# Patient Record
Sex: Male | Born: 1970 | Race: White | Hispanic: No | Marital: Single | Smoking: Current every day smoker
Health system: Southern US, Community
[De-identification: ages and names within clinical notes are randomized; demographics above are authoritative.]

## PROBLEM LIST (undated history)

## (undated) DIAGNOSIS — R569 Unspecified convulsions: Secondary | ICD-10-CM

## (undated) HISTORY — PX: CRANIOTOMY: SHX93

---

## 2017-01-27 ENCOUNTER — Inpatient Hospital Stay (HOSPITAL_COMMUNITY)

## 2017-01-27 ENCOUNTER — Inpatient Hospital Stay

## 2017-01-27 ENCOUNTER — Emergency Department

## 2017-01-27 ENCOUNTER — Inpatient Hospital Stay
Admission: EM | Admit: 2017-01-27 | Discharge: 2017-01-29 | DRG: 100 | Attending: Internal Medicine | Admitting: Internal Medicine

## 2017-01-27 ENCOUNTER — Encounter: Payer: Self-pay | Admitting: Emergency Medicine

## 2017-01-27 DIAGNOSIS — J690 Pneumonitis due to inhalation of food and vomit: Secondary | ICD-10-CM | POA: Diagnosis present

## 2017-01-27 DIAGNOSIS — G40411 Other generalized epilepsy and epileptic syndromes, intractable, with status epilepticus: Secondary | ICD-10-CM | POA: Diagnosis present

## 2017-01-27 DIAGNOSIS — Z4659 Encounter for fitting and adjustment of other gastrointestinal appliance and device: Secondary | ICD-10-CM

## 2017-01-27 DIAGNOSIS — R401 Stupor: Secondary | ICD-10-CM | POA: Diagnosis not present

## 2017-01-27 DIAGNOSIS — R569 Unspecified convulsions: Secondary | ICD-10-CM | POA: Diagnosis present

## 2017-01-27 DIAGNOSIS — N179 Acute kidney failure, unspecified: Secondary | ICD-10-CM | POA: Diagnosis present

## 2017-01-27 DIAGNOSIS — Z9911 Dependence on respirator [ventilator] status: Secondary | ICD-10-CM | POA: Diagnosis not present

## 2017-01-27 DIAGNOSIS — J9811 Atelectasis: Secondary | ICD-10-CM | POA: Diagnosis present

## 2017-01-27 DIAGNOSIS — J9690 Respiratory failure, unspecified, unspecified whether with hypoxia or hypercapnia: Secondary | ICD-10-CM

## 2017-01-27 DIAGNOSIS — F17210 Nicotine dependence, cigarettes, uncomplicated: Secondary | ICD-10-CM | POA: Diagnosis present

## 2017-01-27 DIAGNOSIS — Z87820 Personal history of traumatic brain injury: Secondary | ICD-10-CM

## 2017-01-27 DIAGNOSIS — Z21 Asymptomatic human immunodeficiency virus [HIV] infection status: Secondary | ICD-10-CM | POA: Diagnosis present

## 2017-01-27 DIAGNOSIS — J9601 Acute respiratory failure with hypoxia: Secondary | ICD-10-CM | POA: Diagnosis present

## 2017-01-27 HISTORY — DX: Unspecified convulsions: R56.9

## 2017-01-27 LAB — CBC
HCT: 44.4 % (ref 40.0–52.0)
Hemoglobin: 15.2 g/dL (ref 13.0–18.0)
MCH: 32.1 pg (ref 26.0–34.0)
MCHC: 34.2 g/dL (ref 32.0–36.0)
MCV: 93.8 fL (ref 80.0–100.0)
PLATELETS: 165 10*3/uL (ref 150–440)
RBC: 4.73 MIL/uL (ref 4.40–5.90)
RDW: 12.8 % (ref 11.5–14.5)
WBC: 10.7 10*3/uL — AB (ref 3.8–10.6)

## 2017-01-27 LAB — URINE DRUG SCREEN, QUALITATIVE (ARMC ONLY)
AMPHETAMINES, UR SCREEN: POSITIVE — AB
Amphetamines, Ur Screen: POSITIVE — AB
BARBITURATES, UR SCREEN: NOT DETECTED
BENZODIAZEPINE, UR SCRN: NOT DETECTED
Barbiturates, Ur Screen: NOT DETECTED
Benzodiazepine, Ur Scrn: NOT DETECTED
COCAINE METABOLITE, UR ~~LOC~~: NOT DETECTED
Cannabinoid 50 Ng, Ur ~~LOC~~: NOT DETECTED
Cannabinoid 50 Ng, Ur ~~LOC~~: NOT DETECTED
Cocaine Metabolite,Ur ~~LOC~~: NOT DETECTED
MDMA (ECSTASY) UR SCREEN: NOT DETECTED
MDMA (Ecstasy)Ur Screen: NOT DETECTED
METHADONE SCREEN, URINE: NOT DETECTED
Methadone Scn, Ur: NOT DETECTED
OPIATE, UR SCREEN: NOT DETECTED
Opiate, Ur Screen: NOT DETECTED
PHENCYCLIDINE (PCP) UR S: NOT DETECTED
PHENCYCLIDINE (PCP) UR S: NOT DETECTED
TRICYCLIC, UR SCREEN: NOT DETECTED
Tricyclic, Ur Screen: NOT DETECTED

## 2017-01-27 LAB — URINALYSIS, COMPLETE (UACMP) WITH MICROSCOPIC
BACTERIA UA: NONE SEEN
BILIRUBIN URINE: NEGATIVE
Bilirubin Urine: NEGATIVE
GLUCOSE, UA: NEGATIVE mg/dL
Glucose, UA: NEGATIVE mg/dL
KETONES UR: NEGATIVE mg/dL
Ketones, ur: NEGATIVE mg/dL
LEUKOCYTES UA: NEGATIVE
Leukocytes, UA: NEGATIVE
NITRITE: NEGATIVE
Nitrite: NEGATIVE
PH: 5 (ref 5.0–8.0)
PROTEIN: 100 mg/dL — AB
Protein, ur: 100 mg/dL — AB
SPECIFIC GRAVITY, URINE: 1.018 (ref 1.005–1.030)
SQUAMOUS EPITHELIAL / LPF: NONE SEEN
Specific Gravity, Urine: 1.018 (ref 1.005–1.030)
pH: 5 (ref 5.0–8.0)

## 2017-01-27 LAB — BASIC METABOLIC PANEL
ANION GAP: 13 (ref 5–15)
BUN: 11 mg/dL (ref 6–20)
CHLORIDE: 103 mmol/L (ref 101–111)
CO2: 19 mmol/L — ABNORMAL LOW (ref 22–32)
Calcium: 8.8 mg/dL — ABNORMAL LOW (ref 8.9–10.3)
Creatinine, Ser: 1.3 mg/dL — ABNORMAL HIGH (ref 0.61–1.24)
Glucose, Bld: 102 mg/dL — ABNORMAL HIGH (ref 65–99)
POTASSIUM: 3.6 mmol/L (ref 3.5–5.1)
SODIUM: 135 mmol/L (ref 135–145)

## 2017-01-27 LAB — BLOOD GAS, ARTERIAL
ALLENS TEST (PASS/FAIL): POSITIVE — AB
Acid-base deficit: 3.5 mmol/L — ABNORMAL HIGH (ref 0.0–2.0)
Bicarbonate: 20.4 mmol/L (ref 20.0–28.0)
FIO2: 0.6
LHR: 20 {breaths}/min
O2 SAT: 94 %
PATIENT TEMPERATURE: 37
PCO2 ART: 33 mmHg (ref 32.0–48.0)
PEEP/CPAP: 5 cmH2O
VT: 500 mL
pH, Arterial: 7.4 (ref 7.350–7.450)
pO2, Arterial: 71 mmHg — ABNORMAL LOW (ref 83.0–108.0)

## 2017-01-27 LAB — PHOSPHORUS: Phosphorus: 2.2 mg/dL — ABNORMAL LOW (ref 2.5–4.6)

## 2017-01-27 LAB — MRSA PCR SCREENING: MRSA by PCR: NEGATIVE

## 2017-01-27 LAB — GLUCOSE, CAPILLARY: Glucose-Capillary: 72 mg/dL (ref 65–99)

## 2017-01-27 LAB — ETHANOL

## 2017-01-27 LAB — MAGNESIUM: Magnesium: 1.8 mg/dL (ref 1.7–2.4)

## 2017-01-27 MED ORDER — ACETAMINOPHEN 650 MG RE SUPP: 650.0000 mg | Freq: Four times a day (QID) | RECTAL | Status: DC | PRN

## 2017-01-27 MED ORDER — ENOXAPARIN SODIUM 40 MG/0.4ML ~~LOC~~ SOLN
40.0000 mg | SUBCUTANEOUS | Status: DC
  Administered 2017-01-27: 40 mg via SUBCUTANEOUS
  Filled 2017-01-27: qty 0.4

## 2017-01-27 MED ORDER — PIPERACILLIN-TAZOBACTAM 3.375 G IVPB
3.3750 g | Freq: Three times a day (TID) | INTRAVENOUS | Status: DC
  Administered 2017-01-27 – 2017-01-28 (×3): 3.375 g via INTRAVENOUS
  Filled 2017-01-27 (×5): qty 50

## 2017-01-27 MED ORDER — SODIUM CHLORIDE 0.9 % IV SOLN
1000.0000 mg | Freq: Once | INTRAVENOUS | Status: AC
  Administered 2017-01-27: 1000 mg via INTRAVENOUS
  Filled 2017-01-27: qty 10

## 2017-01-27 MED ORDER — SODIUM CHLORIDE 0.9 % IV BOLUS (SEPSIS)
1000.0000 mL | Freq: Once | INTRAVENOUS | Status: AC
  Administered 2017-01-27: 1000 mL via INTRAVENOUS

## 2017-01-27 MED ORDER — ONDANSETRON HCL 4 MG PO TABS: 4.0000 mg | ORAL_TABLET | Freq: Four times a day (QID) | ORAL | Status: DC | PRN

## 2017-01-27 MED ORDER — MIDAZOLAM HCL 2 MG/2ML IJ SOLN
INTRAMUSCULAR | Status: AC
  Administered 2017-01-27: 2 mg via INTRAVENOUS
  Filled 2017-01-27: qty 2

## 2017-01-27 MED ORDER — ONDANSETRON HCL 4 MG/2ML IJ SOLN: 4.0000 mg | Freq: Four times a day (QID) | INTRAMUSCULAR | Status: DC | PRN

## 2017-01-27 MED ORDER — KCL-LACTATED RINGERS-D5W 20 MEQ/L IV SOLN
INTRAVENOUS | Status: DC
  Administered 2017-01-27 – 2017-01-28 (×2): via INTRAVENOUS
  Filled 2017-01-27 (×3): qty 1000

## 2017-01-27 MED ORDER — ACETAMINOPHEN 325 MG PO TABS: 650.0000 mg | ORAL_TABLET | Freq: Four times a day (QID) | ORAL | Status: DC | PRN

## 2017-01-27 MED ORDER — SODIUM CHLORIDE 0.9 % IV SOLN
500.0000 mg | Freq: Two times a day (BID) | INTRAVENOUS | Status: DC
  Filled 2017-01-27 (×2): qty 5

## 2017-01-27 MED ORDER — SODIUM CHLORIDE 0.9 % IV SOLN
500.0000 mg | Freq: Two times a day (BID) | INTRAVENOUS | Status: DC
  Administered 2017-01-27 – 2017-01-28 (×2): 500 mg via INTRAVENOUS
  Filled 2017-01-27 (×3): qty 5

## 2017-01-27 MED ORDER — FENTANYL CITRATE (PF) 100 MCG/2ML IJ SOLN
50.0000 ug | Freq: Once | INTRAMUSCULAR | Status: AC
  Administered 2017-01-27: 50 ug via INTRAVENOUS

## 2017-01-27 MED ORDER — CHLORHEXIDINE GLUCONATE 0.12% ORAL RINSE (MEDLINE KIT)
15.0000 mL | Freq: Two times a day (BID) | OROMUCOSAL | Status: DC
  Administered 2017-01-27 – 2017-01-28 (×2): 15 mL via OROMUCOSAL

## 2017-01-27 MED ORDER — MIDAZOLAM HCL 2 MG/2ML IJ SOLN
2.0000 mg | Freq: Once | INTRAMUSCULAR | Status: AC
  Administered 2017-01-27: 2 mg via INTRAVENOUS

## 2017-01-27 MED ORDER — SODIUM CHLORIDE 0.9 % IV SOLN
0.0000 ug/h | INTRAVENOUS | Status: DC
  Administered 2017-01-27: 50 ug/h via INTRAVENOUS
  Filled 2017-01-27: qty 50

## 2017-01-27 MED ORDER — VANCOMYCIN HCL IN DEXTROSE 1-5 GM/200ML-% IV SOLN
1000.0000 mg | Freq: Once | INTRAVENOUS | Status: AC
  Administered 2017-01-27: 1000 mg via INTRAVENOUS
  Filled 2017-01-27: qty 200

## 2017-01-27 MED ORDER — LORAZEPAM 2 MG/ML IJ SOLN
INTRAMUSCULAR | Status: AC
  Administered 2017-01-27: 2 mg via INTRAVENOUS
  Filled 2017-01-27: qty 1

## 2017-01-27 MED ORDER — SODIUM CHLORIDE 0.9 % IV SOLN: INTRAVENOUS | Status: DC

## 2017-01-27 MED ORDER — ALBUTEROL SULFATE (2.5 MG/3ML) 0.083% IN NEBU: 2.5000 mg | INHALATION_SOLUTION | RESPIRATORY_TRACT | Status: DC | PRN

## 2017-01-27 MED ORDER — MIDAZOLAM HCL 5 MG/ML IJ SOLN
0.0000 mg/h | INTRAMUSCULAR | Status: DC
  Administered 2017-01-27: 1 mg/h via INTRAVENOUS
  Filled 2017-01-27 (×2): qty 10

## 2017-01-27 MED ORDER — BLISTEX MEDICATED EX OINT
TOPICAL_OINTMENT | CUTANEOUS | Status: DC | PRN
  Filled 2017-01-27: qty 6.3

## 2017-01-27 MED ORDER — LORAZEPAM 2 MG/ML IJ SOLN
2.0000 mg | Freq: Once | INTRAMUSCULAR | Status: AC
  Administered 2017-01-27: 2 mg via INTRAVENOUS

## 2017-01-27 MED ORDER — FAMOTIDINE IN NACL 20-0.9 MG/50ML-% IV SOLN
20.0000 mg | Freq: Two times a day (BID) | INTRAVENOUS | Status: DC
  Administered 2017-01-27: 20 mg via INTRAVENOUS
  Filled 2017-01-27: qty 50

## 2017-01-27 MED ORDER — PIPERACILLIN-TAZOBACTAM 3.375 G IVPB 30 MIN
3.3750 g | Freq: Once | INTRAVENOUS | Status: AC
  Administered 2017-01-27: 3.375 g via INTRAVENOUS
  Filled 2017-01-27: qty 50

## 2017-01-27 MED ORDER — ORAL CARE MOUTH RINSE
15.0000 mL | Freq: Four times a day (QID) | OROMUCOSAL | Status: DC
  Administered 2017-01-27: 15 mL via OROMUCOSAL

## 2017-01-27 MED ORDER — SUCCINYLCHOLINE CHLORIDE 20 MG/ML IJ SOLN
100.0000 mg | Freq: Once | INTRAMUSCULAR | Status: AC
  Administered 2017-01-27: 100 mg via INTRAVENOUS

## 2017-01-27 MED ORDER — ETOMIDATE 2 MG/ML IV SOLN
20.0000 mg | Freq: Once | INTRAVENOUS | Status: AC
  Administered 2017-01-27: 20 mg via INTRAVENOUS

## 2017-01-27 NOTE — Progress Notes (Signed)
Pharmacy Antibiotic Note  Lenoria ChimeChristopher Steffensmeier is a 46 y.o. male admitted on 01/27/2017 with seizures/Aspiration PNA?  Pharmacy has been consulted for Zosyn dosing.  Plan: Zosyn 3.375g IV q8h (4 hour infusion).    Height: 5\' 9"  (175.3 cm) Weight: 150 lb (68 kg) IBW/kg (Calculated) : 70.7  Temp (24hrs), Avg:98.2 F (36.8 C), Min:98.2 F (36.8 C), Max:98.2 F (36.8 C)   Recent Labs Lab 01/27/17 0501  WBC 10.7*  CREATININE 1.30*    Estimated Creatinine Clearance: 69.7 mL/min (A) (by C-G formula based on SCr of 1.3 mg/dL (H)).    No Known Allergies  Antimicrobials this admission: Zosyn 6/18 >>    Vanc x 1 6/18      Dose adjustments this admission:    Microbiology results: 6/19 BCx: pending   UCx:     Sputum:      MRSA PCR:   Chest x-ray shows bibasilar opacity possible aspiration   Thank you for allowing pharmacy to be a part of this patient's care.  Ellsie Violette A 01/27/2017 10:52 AM

## 2017-01-27 NOTE — Progress Notes (Signed)
   01/27/17 1204  Vitals  Temp 99.2 F (37.3 C)  Temp Source Oral  BP 112/71  MAP (mmHg) 84  Pulse Rate 87  ECG Heart Rate 86  Resp 20  Oxygen Therapy  SpO2 99 %  O2 Device Ventilator  FiO2 (%) 60 %  Pulse Oximetry Type Continuous  End Tidal CO2 (EtCO2) 25  Glasgow Coma Scale  Eye Opening 1  Modified Verbal Response (INTUBATED) 1  Best Motor Response 4  Glasgow Coma Scale Score 6  Height and Weight  Height 5\' 6"  (1.676 m)  Weight 69.6 kg (153 lb 7 oz)  Type of Scale Used Bed  Type of Weight Actual  Drug Calculation Weight 69.6 kg (153 lb 7 oz)  BSA (Calculated - sq m) 1.8 sq meters  BMI (Calculated) 24.8  Weight in (lb) to have BMI = 25 154.6

## 2017-01-27 NOTE — Plan of Care (Addendum)
Pt's Mother called, she stated [facility] stating that the patient was in the hospital. She wanted to relay the medications that he is receiving in the community    Topiramate 200 mg PO bid Lacosamide 100 mg PO BID Keppra 15 mg PO BID Zoloft 150 PO QD  She stated that he is on a HIV medication and that it was recently changed, she didn't know the name but [facility] and [facility] at the [facility] on [address] in [place]. They would probably know.  Pt's mother is Benedetto CoonsWanda Fauth [phone]   [facility]. She understands that she cannot visit at this time  Would appreciate pharmacy input for med reconcilliation. Thanks

## 2017-01-27 NOTE — H&P (Signed)
[facility] [place] at [facility]   PATIENT NAME: Jeffery Holmes    MR#:  [id number]  DATE OF BIRTH:  03/26/1971  DATE OF ADMISSION:  01/27/2017  PRIMARY CARE PHYSICIAN: Patient, No Pcp Per   REQUESTING/REFERRING PHYSICIAN: Dr. Zenda Alpers  CHIEF COMPLAINT:  Intractable seizures and hypoxia  HISTORY OF PRESENT ILLNESS:  Jeffery Holmes  is a 46 y.o. male with a known history ofKnown seizures suspected due to traumatic brain injury with history of craniotomy in the past for unknown reason comes to the emergency room from [facility] after having intractable seizures first few times. Patient was in the jail since Monday which is 3 days ago and has not been getting his and has seizure medicine due to reason not known. He was brought to the emergency room where seizures generalized tonic-clonic were witnessed by ED M.D. Patient started becoming hypoxic and postictal was unable to protect his airways and got intubated and on the ventilator his sats dropped down in the 80s. Chest x-ray shows bibasilar opacity possible aspiration with significant amount of sputum in the ET tube. He received IV vancomycin and Zosyn. He received Ativan and Keppra. Patient is being admitted for further evaluation management. No family in the ER.   PAST MEDICAL HISTORY:   Past Medical History:  Diagnosis Date  . Seizures (HCC)     PAST SURGICAL HISTOIRY:   Past Surgical History:  Procedure Laterality Date  . CRANIOTOMY      SOCIAL HISTORY:   Social History  Substance Use Topics  . Smoking status: Current Every Day Smoker  . Smokeless tobacco: Former Neurosurgeon  . Alcohol use No    FAMILY HISTORY:  History reviewed. No pertinent family history. Unable to obtain since patient is intubated  DRUG ALLERGIES:  No Known Allergies  REVIEW OF SYSTEMS:  Review of Systems  Unable to perform ROS: Intubated     MEDICATIONS AT HOME:   Prior to Admission medications   Not on  File      VITAL SIGNS:  Blood pressure 117/79, pulse 83, temperature 98.2 F (36.8 C), temperature source Oral, resp. rate (!) 22, height 5\' 9"  (1.753 m), weight 68 kg (150 lb), SpO2 97 %.  PHYSICAL EXAMINATION:  GENERAL:  47 y.o.-year-old patient lying in the bed with no acute distress. Critically ill intubated on the ventilator  EYES: Pupils equal, round, reactive to light and accommodation. No scleral icterus. Extraocular muscles intact.  HEENT: Head atraumatic, normocephalic. Oropharynx and nasopharynx clear.  NECK:  Supple, no jugular venous distention. No thyroid enlargement, no tenderness.  LUNGS: Normal breath sounds bilaterally, no wheezing, rales,rhonchi or crepitation. No use of accessory muscles of respiration.  CARDIOVASCULAR: S1, S2 normal. No murmurs, rubs, or gallops.  ABDOMEN: Soft, nontender, nondistended. Bowel sounds present. No organomegaly or mass.  EXTREMITIES: No pedal edema, cyanosis, or clubbing.  NEUROLOGIC:Intubated Psychiatry C: The patient isintubated and on the ventilator SKIN: No obvious rash, lesion, or ulcer.   LABORATORY PANEL:   CBC  Recent Labs Lab 01/27/17 0501  WBC 10.7*  HGB 15.2  HCT 44.4  PLT 165   ------------------------------------------------------------------------------------------------------------------  Chemistries   Recent Labs Lab 01/27/17 0501  NA 135  K 3.6  CL 103  CO2 19*  GLUCOSE 102*  BUN 11  CREATININE 1.30*  CALCIUM 8.8*   ------------------------------------------------------------------------------------------------------------------  Cardiac Enzymes No results for input(s): TROPONINI in the last 168 hours. ------------------------------------------------------------------------------------------------------------------  RADIOLOGY:  Ct Head Wo Contrast  Result Date: 01/27/2017 CLINICAL DATA:  Seizure in jail, striking head. EXAM: CT HEAD WITHOUT CONTRAST TECHNIQUE: Contiguous axial images were  obtained from the base of the skull through the vertex without intravenous contrast. COMPARISON:  None. FINDINGS: Brain: Large area of right frontal encephalomalacia with associated ex vacuo dilatation of the right lateral ventricle. No acute hemorrhage, mass effect or midline shift. No subdural or extra-axial fluid collection. Basilar cisterns are patent. Vascular: No hyperdense vessel or unexpected calcification. Skull: Right temporofrontal craniotomy.  No acute fracture. Sinuses/Orbits: Mucosal thickening involving the right maxillary sinus, sphenoid sinus and ethmoid air cells. Mastoid air cells are clear. Visualized orbits are unremarkable. Other: None. IMPRESSION: 1. No evidence of acute intracranial abnormality. 2. Right frontal craniotomy with encephalomalacia of the right frontal lobe. Ex vacuo dilatation of the right lateral ventricle. 3. Paranasal sinus inflammation, involving right maxillary sinus, sphenoid sinuses and scattered ethmoid air cells. Electronically Signed   By: Rubye Oaks M.D.   On: 01/27/2017 05:32   Dg Chest Portable 1 View  Result Date: 01/27/2017 CLINICAL DATA:  Initial evaluation for endotracheal tube placement. EXAM: PORTABLE CHEST 1 VIEW COMPARISON:  None available. FINDINGS: Endotracheal tube in place with tip positioned 3.1 cm above the carina. Heart size within normal limits. Mediastinal silhouette normal. Lungs hypoinflated. Bibasilar opacities favored to reflect atelectasis, although superimposed infiltrate could be considered in the correct clinical setting. No pulmonary edema or pleural effusion. No pneumothorax. No acute osseus abnormality. IMPRESSION: 1. Tip of the endotracheal tube 3.1 cm above the carina. 2. Shallow lung inflation with patchy bibasilar opacities, left greater than right. Atelectasis is favored, although infiltrates could be considered in the correct clinical setting. Electronically Signed   By: Rise Mu M.D.   On: 01/27/2017 06:40     EKG:  Normal sinus rhythm  IMPRESSION AND PLAN:   Jeffery Holmes  is a 46 y.o. male with a known history ofKnown seizures suspected due to traumatic brain injury with history of craniotomy in the past for unknown reason comes to the emergency room from [facility] after having intractable seizures first few times. Patient was in the jail since Monday which is 3 days ago and has not been getting his and has seizure medicine due to reason not known. He was brought to the emergency room where seizures generalized tonic-clonic were witnessed by ED M.D.   1. Generalized tonic-clonic seizures with known history of chronic seizures suspected due to patient not taking his medication since his being in the jail for last 3 days -Admit to ICU -IV Keppra 500 twice a day -Neurology consultation -CT head shows chronic changes from craniotomy in the past -Patient has history of TBI (reason not known) -Continue fentanyl and Versed for sedation.  2. Acute hypoxic respiratory failure in the setting of tonic-clonic seizures and bilateral opacities on chest x-ray with possibility of aspiration pneumonia -IV Zosyn for now. Pharmacy consultation -Sputum culture -Follow blood culture  3. History of substance abuse -Urine drug screen  4. Acute renal failure appears prerenal azotemia -continue IV fluids  5. DVT prophylaxis subcutaneous Lovenox  Patient is critically ill. ICU attending is aware of patient's admission No family present   All the records are reviewed and case discussed with ED provider. Management plans discussed with the patient, family and they are in agreement.  CODE STATUS:Full  TOTALCRITICAL  TIME TAKING CARE OF THIS PATIENT: 50  minutes.    Annalysia Willenbring M.D on 01/27/2017 at 7:37 AM  Between 7am to 6pm - Pager - [phone]  After  6pm go to [email or web address] - password [email or web address] [facility]  [facility]  Office  [phone]  CC: Primary care physician; Patient, No Pcp  Per

## 2017-01-27 NOTE — Progress Notes (Signed)
Pharmacy Antibiotic Note  Jeffery Holmes is a 46 y.o. male admitted on 01/27/2017 with seizures/Aspiration PNA?  Pharmacy has been consulted for Zosyn dosing.  Plan: Continue Zosyn 3.375g IV q8h (4 hour infusion).    Height: 5\' 6"  (167.6 cm) Weight: 153 lb 7 oz (69.6 kg) IBW/kg (Calculated) : 63.8  Temp (24hrs), Avg:98.7 F (37.1 C), Min:98.2 F (36.8 C), Max:99.2 F (37.3 C)   Recent Labs Lab 01/27/17 0501  WBC 10.7*  CREATININE 1.30*    Estimated Creatinine Clearance: 65.4 mL/min (A) (by C-G formula based on SCr of 1.3 mg/dL (H)).    No Known Allergies  Antimicrobials this admission: Zosyn 6/18 >>    Vanc x 1 6/18      Dose adjustments this admission:    Microbiology results: 6/19 BCx: sent UCx: sent Sputum:   sent  MRSA PCR:  negative Chest x-ray shows bibasilar opacity possible aspiration   Thank you for allowing pharmacy to be a part of this patient's care.  Valentina GuChristy, Braileigh Landenberger D 01/27/2017 3:46 PM

## 2017-01-27 NOTE — ED Notes (Signed)
Pt unable to keep O2 above 90% on non re breather and nasal trumpet. MD made aware.

## 2017-01-27 NOTE — ED Notes (Signed)
Forensic restraints taken off per [facility] officer.

## 2017-01-27 NOTE — Progress Notes (Signed)
   01/27/17 1700  Vitals  Temp 99.9 F (37.7 C)  Temp Source Oral  BP 111/74  MAP (mmHg) 86  Pulse Rate 95  ECG Heart Rate 95  Resp 17  Oxygen Therapy  SpO2 97 %  O2 Device Ventilator  FiO2 (%) 50 %  Pulse Oximetry Type Continuous  End Tidal CO2 (EtCO2) 31

## 2017-01-27 NOTE — Consult Note (Signed)
Reason for Consult:Status Epilepticus Referring Physician: Allena Katz  CC: Status Epilepticus  HPI: Jeffery Holmes is an 46 y.o. male who is unable to provide any history due to intubation and sedation and no family available.  All history obtained from the chart. Patient came to the hospital yesterday with 3 seizures. According to EMS the patient had 3 seizures since about 1:30 AM. The patient came from jail. He did not receive any medications while there. According to the patient he does have a history of seizures but the facility could not verify the patient's medications and he remained 3 days without seizure medications. According to EMS the patient bumped all over the cell and has some bruises to his head and his shoulder. Patient had another seizure in the ED and was intubated.   The patient denied drinking. 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   Past Medical History:  Diagnosis Date  . Seizures (HCC)     Past Surgical History:  Procedure Laterality Date  . CRANIOTOMY      Family history: Unable to obtain due to intubation and sedation  Social History:  reports that he has been smoking.  He has quit using smokeless tobacco. He reports that he uses drugs, including Methamphetamines. He reports that he does not drink alcohol.  No Known Allergies  Medications:  I have reviewed the patient's current medications. Prior to Admission:  (Not in a hospital admission) Scheduled: . enoxaparin (LOVENOX) injection  40 mg Subcutaneous Q24H    ROS: Unable to obtain due to intubation and sedation  Physical Examination: Blood pressure 120/82, pulse 83, temperature 98.2 F (36.8 C), temperature source Oral, resp. rate (!) 27, height 5\' 9"  (1.753 m), weight 68 kg (150 lb), SpO2 98 %.  HEENT-  Normocephalic, no lesions, without obvious abnormality.  Normal external eye and conjunctiva.  Normal TM's bilaterally.  Normal auditory canals and external ears. Normal external nose, mucus  membranes and septum.  Normal pharynx. Cardiovascular- S1, S2 normal, pulses palpable throughout   Lungs- chest clear, no wheezing, rales, normal symmetric air entry Abdomen- soft, non-tender; bowel sounds normal; no masses,  no organomegaly Extremities- no edema Lymph-no adenopathy palpable Musculoskeletal-no joint tenderness, deformity or swelling Skin-warm and dry, no hyperpigmentation, vitiligo, or suspicious lesions  Neurological Examination   Mental Status: Patient does not respond to verbal stimuli.  Does not respond to deep sternal rub.  Does not follow commands.  No verbalizations noted.  Cranial Nerves: II: patient does not respond confrontation bilaterally, pupils right 2 mm, left 2 mm,and sluggishly bilaterally III,IV,VI: doll's response absent bilaterally.  V,VII: corneal reflex reduced bilaterally  VIII: patient does not respond to verbal stimuli IX,X: gag reflex reduced, XI: trapezius strength unable to test bilaterally XII: tongue strength unable to test Motor: Extremities flaccid throughout.  No spontaneous movement noted.  No purposeful movements noted. Sensory: Does not respond to noxious stimuli in any extremity. Deep Tendon Reflexes:  2+ throughout. Plantars: downgoing bilaterally Cerebellar: Unable to perform   Laboratory Studies:   Basic Metabolic Panel:  Recent Labs Lab 01/27/17 0501  NA 135  K 3.6  CL 103  CO2 19*  GLUCOSE 102*  BUN 11  CREATININE 1.30*  CALCIUM 8.8*    Liver Function Tests: No results for input(s): AST, ALT, ALKPHOS, BILITOT, PROT, ALBUMIN in the last 168 hours. No results for input(s): LIPASE, AMYLASE in the last 168 hours. No results for input(s): AMMONIA in the last 168 hours.  CBC:  Recent Labs Lab  01/27/17 0501  WBC 10.7*  HGB 15.2  HCT 44.4  MCV 93.8  PLT 165    Cardiac Enzymes: No results for input(s): CKTOTAL, CKMB, CKMBINDEX, TROPONINI in the last 168 hours.  BNP: Invalid input(s):  POCBNP  CBG: No results for input(s): GLUCAP in the last 168 hours.  Microbiology: No results found for this or any previous visit.  Coagulation Studies: No results for input(s): LABPROT, INR in the last 72 hours.  Urinalysis:  Recent Labs Lab 01/27/17 0733  COLORURINE YELLOW*  LABSPEC 1.018  PHURINE 5.0  GLUCOSEU NEGATIVE  HGBUR MODERATE*  BILIRUBINUR NEGATIVE  KETONESUR NEGATIVE  PROTEINUR 100*  NITRITE NEGATIVE  LEUKOCYTESUR NEGATIVE    Lipid Panel:  No results found for: CHOL, TRIG, HDL, CHOLHDL, VLDL, LDLCALC  HgbA1C: No results found for: HGBA1C  Urine Drug Screen:     Component Value Date/Time   LABOPIA NONE DETECTED 01/27/2017 0638   COCAINSCRNUR NONE DETECTED 01/27/2017 0638   LABBENZ NONE DETECTED 01/27/2017 0638   AMPHETMU POSITIVE (A) 01/27/2017 0638   THCU NONE DETECTED 01/27/2017 0638   LABBARB NONE DETECTED 01/27/2017 0638    Alcohol Level:  Recent Labs Lab 01/27/17 0501  ETH <5    Imaging: Ct Head Wo Contrast  Result Date: 01/27/2017 CLINICAL DATA:  Seizure in jail, striking head. EXAM: CT HEAD WITHOUT CONTRAST TECHNIQUE: Contiguous axial images were obtained from the base of the skull through the vertex without intravenous contrast. COMPARISON:  None. FINDINGS: Brain: Large area of right frontal encephalomalacia with associated ex vacuo dilatation of the right lateral ventricle. No acute hemorrhage, mass effect or midline shift. No subdural or extra-axial fluid collection. Basilar cisterns are patent. Vascular: No hyperdense vessel or unexpected calcification. Skull: Right temporofrontal craniotomy.  No acute fracture. Sinuses/Orbits: Mucosal thickening involving the right maxillary sinus, sphenoid sinus and ethmoid air cells. Mastoid air cells are clear. Visualized orbits are unremarkable. Other: None. IMPRESSION: 1. No evidence of acute intracranial abnormality. 2. Right frontal craniotomy with encephalomalacia of the right frontal lobe. Ex vacuo  dilatation of the right lateral ventricle. 3. Paranasal sinus inflammation, involving right maxillary sinus, sphenoid sinuses and scattered ethmoid air cells. Electronically Signed   By: Rubye OaksMelanie  Ehinger M.D.   On: 01/27/2017 05:32   Dg Chest Portable 1 View  Result Date: 01/27/2017 CLINICAL DATA:  Initial evaluation for endotracheal tube placement. EXAM: PORTABLE CHEST 1 VIEW COMPARISON:  None available. FINDINGS: Endotracheal tube in place with tip positioned 3.1 cm above the carina. Heart size within normal limits. Mediastinal silhouette normal. Lungs hypoinflated. Bibasilar opacities favored to reflect atelectasis, although superimposed infiltrate could be considered in the correct clinical setting. No pulmonary edema or pleural effusion. No pneumothorax. No acute osseus abnormality. IMPRESSION: 1. Tip of the endotracheal tube 3.1 cm above the carina. 2. Shallow lung inflation with patchy bibasilar opacities, left greater than right. Atelectasis is favored, although infiltrates could be considered in the correct clinical setting. Electronically Signed   By: Rise MuBenjamin  McClintock M.D.   On: 01/27/2017 06:40     Assessment/Plan: 46 year old male with a history of seizures on unknown anticonvulsant therapy now without medications for three days.  Patient presents with status epilepticus.  Now intubated and sedated with no clinical evidence of seizure activity noted.  Head CT reviewed and shows evidence of craniotomy but no acute changes.  Patient loaded with Keppra.     Recommendations: 1.  EEG stat.  Tech aware. 2.  Continue maintenance Keppra 500mg  IV q12 hours with  initiation of home meds once these are determined. 3.  Seizure precautions   Thana Farr, MD Neurology [phone] 01/27/2017, 11:28 AM

## 2017-01-27 NOTE — ED Notes (Signed)
Pt stops talking to this RN, pt starring into space, RN to hold pt while this RN to pyxis for medication. Pt rolled to side, airway maintained with 7.620mm nasal trumpet and non re breather. MD at bedside.

## 2017-01-27 NOTE — ED Notes (Addendum)
Pt resting in bed, sheriff dept guard at bedside, vitals stable, will continue to monitor, tolerating ET tube

## 2017-01-27 NOTE — Consult Note (Signed)
PULMONARY / CRITICAL CARE MEDICINE   Name: Jeffery Holmes MRN: [id number] DOB: 1970-11-21    ADMISSION DATE:  01/27/2017  PT PROFILE: 65 M with seizure disorder who was in police custody and without access to his anticonvulsants. He suffered grand mal seizure. Brought to emergency department where he required intubation for airway protection  HISTORY OF PRESENT ILLNESS:   Level V caveat  PAST MEDICAL HISTORY :  He  has a past medical history of Seizures (HCC).  PAST SURGICAL HISTORY: He  has a past surgical history that includes Craniotomy.  No Known Allergies  No current facility-administered medications on file prior to encounter.    No current outpatient prescriptions on file prior to encounter.    FAMILY HISTORY:  His has no family status information on file.    SOCIAL HISTORY: He  reports that he has been smoking.  He has quit using smokeless tobacco. He reports that he uses drugs, including Methamphetamines. He reports that he does not drink alcohol.  REVIEW OF SYSTEMS:   Level V caveat  SUBJECTIVE:    VITAL SIGNS: BP 132/79   Pulse 86   Temp 99.2 F (37.3 C) (Oral)   Resp 20   Ht 5\' 6"  (1.676 m)   Wt 153 lb 7 oz (69.6 kg)   SpO2 100%   BMI 24.77 kg/m   HEMODYNAMICS:    VENTILATOR SETTINGS: Vent Mode: PRVC FiO2 (%):  [50 %-60 %] 50 % Set Rate:  [20 bmp] 20 bmp Vt Set:  [500 mL] 500 mL PEEP:  [5 cmH20] 5 cmH20  INTAKE / OUTPUT: I/O last 3 completed shifts: In: -  Out: 250 [Urine:250]  PHYSICAL EXAMINATION:  Gen: Somewhat disheveled, intubated, sedated HEENT: NCAT, sclerae white, oropharynx normal Neck: NO LAN, no JVD noted Lungs: full BS without adventitious sounds Cardiovascular: Reg rate, normal rhythm, no M noted Abdomen: Soft, NT, +BS Ext: no C/C/E Neuro: PERRL, EOMI, no spontaneous movement, DTRs symmetric Skin: No lesions noted   LABS:  BMET  Recent Labs Lab 01/27/17 0501  NA 135  K 3.6  CL 103  CO2 19*  BUN 11   CREATININE 1.30*  GLUCOSE 102*    Electrolytes  Recent Labs Lab 01/27/17 0501 01/27/17 1154  CALCIUM 8.8*  --   MG  --  1.8  PHOS  --  2.2*    CBC  Recent Labs Lab 01/27/17 0501  WBC 10.7*  HGB 15.2  HCT 44.4  PLT 165    Coag's No results for input(s): APTT, INR in the last 168 hours.  Sepsis Markers No results for input(s): LATICACIDVEN, PROCALCITON, O2SATVEN in the last 168 hours.  ABG  Recent Labs Lab 01/27/17 0737  PHART 7.40  PCO2ART 33  PO2ART 71*    Liver Enzymes No results for input(s): AST, ALT, ALKPHOS, BILITOT, ALBUMIN in the last 168 hours.  Cardiac Enzymes No results for input(s): TROPONINI, PROBNP in the last 168 hours.  Glucose  Recent Labs Lab 01/27/17 1150  GLUCAP 72    Imaging Ct Head Wo Contrast  Result Date: 01/27/2017 CLINICAL DATA:  Seizure in jail, striking head. EXAM: CT HEAD WITHOUT CONTRAST TECHNIQUE: Contiguous axial images were obtained from the base of the skull through the vertex without intravenous contrast. COMPARISON:  None. FINDINGS: Brain: Large area of right frontal encephalomalacia with associated ex vacuo dilatation of the right lateral ventricle. No acute hemorrhage, mass effect or midline shift. No subdural or extra-axial fluid collection. Basilar cisterns are patent. Vascular: No hyperdense  vessel or unexpected calcification. Skull: Right temporofrontal craniotomy.  No acute fracture. Sinuses/Orbits: Mucosal thickening involving the right maxillary sinus, sphenoid sinus and ethmoid air cells. Mastoid air cells are clear. Visualized orbits are unremarkable. Other: None. IMPRESSION: 1. No evidence of acute intracranial abnormality. 2. Right frontal craniotomy with encephalomalacia of the right frontal lobe. Ex vacuo dilatation of the right lateral ventricle. 3. Paranasal sinus inflammation, involving right maxillary sinus, sphenoid sinuses and scattered ethmoid air cells. Electronically Signed   By: Rubye OaksMelanie  Ehinger  M.D.   On: 01/27/2017 05:32   Dg Chest Portable 1 View  Result Date: 01/27/2017 CLINICAL DATA:  Initial evaluation for endotracheal tube placement. EXAM: PORTABLE CHEST 1 VIEW COMPARISON:  None available. FINDINGS: Endotracheal tube in place with tip positioned 3.1 cm above the carina. Heart size within normal limits. Mediastinal silhouette normal. Lungs hypoinflated. Bibasilar opacities favored to reflect atelectasis, although superimposed infiltrate could be considered in the correct clinical setting. No pulmonary edema or pleural effusion. No pneumothorax. No acute osseus abnormality. IMPRESSION: 1. Tip of the endotracheal tube 3.1 cm above the carina. 2. Shallow lung inflation with patchy bibasilar opacities, left greater than right. Atelectasis is favored, although infiltrates could be considered in the correct clinical setting. Electronically Signed   By: Rise MuBenjamin  McClintock M.D.   On: 01/27/2017 06:40      ASSESSMENT / PLAN: Status epilepticus Vent dependent respiratory failure - intubated for altered mental status Concern raised for aspiration pneumonia - I do not see any discrete infiltrates on his current chest x-ray  Vent settings established Vent bundle implemented Daily WUA/SBT as indicated Continue Zosyn for now Check PCT in a.m. 06/20 If PCT and chest x-ray are unimpressive, consider discontinuation of antibiotics and a.m. 06/20 PAD protocol - fentanyl and midazolam Neuro consult pending  Billy Fischeravid Deaveon Schoen, MD PCCM service Mobile [phone] Pager [phone] 01/27/2017 2:15 PM

## 2017-01-27 NOTE — Progress Notes (Signed)
CH returned to check on PT. PT is still unconscious. CH reiterated his availability if needed.     01/27/17 1645  Clinical Encounter Type  Visited With Patient  Visit Type Follow-up  Referral From Nurse  Consult/Referral To Chaplain  Spiritual Encounters  Spiritual Needs Prayer

## 2017-01-27 NOTE — ED Notes (Signed)
Dr. Reynolds at bedside.

## 2017-01-27 NOTE — ED Provider Notes (Signed)
[facility] Emergency Department Provider Note   ____________________________________________   First MD Initiated Contact with Patient 01/27/17 [phone]     (approximate)  I have reviewed the triage vital signs and the nursing notes.   HISTORY  Chief Complaint Seizures  The patient has some confusion  HPI Jeffery Holmes is a 46 y.o. male who comes into the hospital today with 3 seizures. According to EMS the patient has had 3 seizures since about 1:30 AM. The patient is coming from jail. He did not receive any medications while there. According to the patient he does have a history of seizures but the facility could not verify the patient's medications that he saw him 3 days without seizure medications. According to EMS the patient bumped all over the cell and has some bruises to his head and his shoulder. He did hit his head on the jail cell bars. The patient according to EMS is awake but he is confused.the patient denies any pain at this time. He is here for evaluation. The patient denies drinking. 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   Past Medical History:  Diagnosis Date  . Seizures [facility](HCC)     Patient Active Problem List   Diagnosis Date Noted  . Seizure (HCC) 01/27/2017    Past Surgical History:  Procedure Laterality Date  . CRANIOTOMY      Prior to Admission medications   Not on File    Allergies Patient has no known allergies.  History reviewed. No pertinent family history.  Social History Social History  Substance Use Topics  . Smoking status: Current Every Day Smoker  . Smokeless tobacco: Former NeurosurgeonUser  . Alcohol use No    Review of Systems  Constitutional: No fever/chills Eyes: No visual changes. ENT: No sore throat. Cardiovascular: Denies chest pain. Respiratory: Denies shortness of breath. Gastrointestinal: No abdominal pain.  No nausea, no vomiting.  No diarrhea.  No constipation. Genitourinary: Negative  for dysuria. Musculoskeletal: Negative for back pain. Skin: Negative for rash. Neurological: seizures   ____________________________________________   PHYSICAL EXAM:  VITAL SIGNS: ED Triage Vitals  Enc Vitals Group     BP 01/27/17 0442 (!) 144/90     Pulse Rate 01/27/17 0442 84     Resp 01/27/17 0442 16     Temp 01/27/17 0442 98.2 F (36.8 C)     Temp Source 01/27/17 0442 Oral     SpO2 01/27/17 0442 96 %     Weight 01/27/17 0445 150 lb (68 kg)     Height 01/27/17 0445 5\' 9"  (1.753 m)     Head Circumference --      Peak Flow --      Pain Score --      Pain Loc --      Pain Edu? --      Excl. in GC? --     Constitutional: Alert and confused. Disheveled appearing and in moderate distress. Eyes: Conjunctivae are normal. PERRL. EOMI. Head: Atraumatic. Nose: No congestion/rhinnorhea. Mouth/Throat: Mucous membranes are moist.  Oropharynx non-erythematous. Cardiovascular: Normal rate, regular rhythm. Grossly normal heart sounds.  Good peripheral circulation. Respiratory: Normal respiratory effort.  No retractions. Lungs CTAB. Gastrointestinal: Soft and nontender. No distention. Positive bowel sounds Musculoskeletal: No lower extremity tenderness nor edema.   Neurologic:  Normal speech and language. Cranial nerves II through XII are grossly intact with no focal motor or neuro deficits Skin:  Skin is warm, dry and intact.  Psychiatric: Mood and affect are  normal.   ____________________________________________   LABS (all labs ordered are listed, but only abnormal results are displayed)  Labs Reviewed  CBC - Abnormal; Notable for the following:       Result Value   WBC 10.7 (*)    All other components within normal limits  BASIC METABOLIC PANEL - Abnormal; Notable for the following:    CO2 19 (*)    Glucose, Bld 102 (*)    Creatinine, Ser 1.30 (*)    Calcium 8.8 (*)    All other components within normal limits  CULTURE, BLOOD (ROUTINE X 2)  CULTURE, BLOOD (ROUTINE X  2)  ETHANOL  URINE DRUG SCREEN, QUALITATIVE ([facility] ONLY)  BLOOD GAS, ARTERIAL   ____________________________________________  EKG  ED ECG REPORT I, Rebecka Apley, the attending physician, personally viewed and interpreted this ECG.   Date: 01/27/2017  EKG Time: 441  Rate: 86  Rhythm: normal sinus rhythm  Axis: normal  Intervals:none  ST&T Change: none  ____________________________________________  RADIOLOGY  Ct Head Wo Contrast  Result Date: 01/27/2017 CLINICAL DATA:  Seizure in jail, striking head. EXAM: CT HEAD WITHOUT CONTRAST TECHNIQUE: Contiguous axial images were obtained from the base of the skull through the vertex without intravenous contrast. COMPARISON:  None. FINDINGS: Brain: Large area of right frontal encephalomalacia with associated ex vacuo dilatation of the right lateral ventricle. No acute hemorrhage, mass effect or midline shift. No subdural or extra-axial fluid collection. Basilar cisterns are patent. Vascular: No hyperdense vessel or unexpected calcification. Skull: Right temporofrontal craniotomy.  No acute fracture. Sinuses/Orbits: Mucosal thickening involving the right maxillary sinus, sphenoid sinus and ethmoid air cells. Mastoid air cells are clear. Visualized orbits are unremarkable. Other: None. IMPRESSION: 1. No evidence of acute intracranial abnormality. 2. Right frontal craniotomy with encephalomalacia of the right frontal lobe. Ex vacuo dilatation of the right lateral ventricle. 3. Paranasal sinus inflammation, involving right maxillary sinus, sphenoid sinuses and scattered ethmoid air cells. Electronically Signed   By: Rubye Oaks M.D.   On: 01/27/2017 05:32   Dg Chest Portable 1 View  Result Date: 01/27/2017 CLINICAL DATA:  Initial evaluation for endotracheal tube placement. EXAM: PORTABLE CHEST 1 VIEW COMPARISON:  None available. FINDINGS: Endotracheal tube in place with tip positioned 3.1 cm above the carina. Heart size within normal limits.  Mediastinal silhouette normal. Lungs hypoinflated. Bibasilar opacities favored to reflect atelectasis, although superimposed infiltrate could be considered in the correct clinical setting. No pulmonary edema or pleural effusion. No pneumothorax. No acute osseus abnormality. IMPRESSION: 1. Tip of the endotracheal tube 3.1 cm above the carina. 2. Shallow lung inflation with patchy bibasilar opacities, left greater than right. Atelectasis is favored, although infiltrates could be considered in the correct clinical setting. Electronically Signed   By: Rise Mu M.D.   On: 01/27/2017 06:40    ____________________________________________   PROCEDURES  Procedure(s) performed: please, see procedure note(s).  Procedure Name: Intubation Date/Time: 01/27/2017 6:15 AM Performed by: Rebecka Apley Pre-anesthesia Checklist: Emergency Drugs available and Patient identified Oxygen Delivery Method: Ambu bag Preoxygenation: Pre-oxygenation with 100% oxygen Intubation Type: Rapid sequence Ventilation: Mask ventilation without difficulty Laryngoscope Size: Glidescope and 4 Nasal Tubes: Left Tube size: 7.5 mm Number of attempts: 1 Airway Equipment and Method: Rigid stylet Placement Confirmation: ETT inserted through vocal cords under direct vision,  Positive ETCO2,  Breath sounds checked- equal and bilateral and CO2 detector Secured at: 22 cm Tube secured with: ETT holder Difficulty Due To: Difficulty was unanticipated  Critical Care performed: Yes, see critical care note(s)  CRITICAL CARE Performed by: Lucrezia Europe P   Total critical care time: 45 minutes  Critical care time was exclusive of separately billable procedures and treating other patients.  Critical care was necessary to treat or prevent imminent or life-threatening deterioration.  Critical care was time spent personally by me on the following activities: development of treatment plan with patient and/or  surrogate as well as nursing, discussions with consultants, evaluation of patient's response to treatment, examination of patient, obtaining history from patient or surrogate, ordering and performing treatments and interventions, ordering and review of laboratory studies, ordering and review of radiographic studies, pulse oximetry and re-evaluation of patient's condition.  ____________________________________________   INITIAL IMPRESSION / ASSESSMENT AND PLAN / ED COURSE  Pertinent labs & imaging results that were available during my care of the patient were reviewed by me and considered in my medical decision making (see chart for details).  This is a 46 year old male who comes into the hospital today with some seizures. The patient has been in jail and has not received seizure medication. After his initial evaluation the patient had a seizure. He was foaming at the mouth and didn't decrease his oxygen saturation to the 20s. We did initially place a nonrebreather on the patient and then bagged him through the seizure. The patient received 2 mg of Ativan for his seizure. The patient became sonorous we did place a nasal trumpet. At first I felt that the patient may wake up and have improvement in his breathing. After monitoring the patient remains sonorous. The decision was made to intubate the patient. He did decrease his oxygen saturation to 89% with a nasal trumpet and a nonrebreather in place. The patient will be admitted to the hospitalist service in the intensive care unit. The patient also received a dose of Keppra. The patient was placed on a Versed and a fentanyl drip. He received a liter of normal saline. We are still awaiting the results of the patient's urine drug screen.  Clinical Course as of Jan 27 737  Tue Jan 27, 2017  0552 1. No evidence of acute intracranial abnormality. 2. Right frontal craniotomy with encephalomalacia of the right frontal lobe. Ex vacuo dilatation of the right  lateral ventricle. 3. Paranasal sinus inflammation, involving right maxillary sinus, sphenoid sinuses and scattered ethmoid air cells.   CT Head Wo Contrast [AW]  0715 1. Tip of the endotracheal tube 3.1 cm above the carina. 2. Shallow lung inflation with patchy bibasilar opacities, left greater than right. Atelectasis is favored, although infiltrates could be considered in the correct clinical setting   DG Chest Portable 1 View [AW]    Clinical Course User Index [AW] Rebecka Apley, MD   After receiving the results of the patient's x-ray with bilateral bibasilar patchy opacities the concern was for aspiration and the patient did receive a dose of vancomycin and Zosyn.  ____________________________________________   FINAL CLINICAL IMPRESSION(S) / ED DIAGNOSES  Final diagnoses:  Seizure (HCC)  Aspiration pneumonia of both lower lobes, unspecified aspiration pneumonia type (HCC)      NEW MEDICATIONS STARTED DURING THIS VISIT:  New Prescriptions   No medications on file     Note:  This document was prepared using Dragon voice recognition software and may include unintentional dictation errors.    Rebecka Apley, MD 01/27/17 [phone]

## 2017-01-27 NOTE — ED Triage Notes (Signed)
Pt to ED per EMS [facility] where EMS reports pt has had 3 seizures since 0130 this AM. Per EMS, pt has history of seizures, due meds but jail has not received med rec from pts pharmacy. Pt has not had medication x3 days. Pt A&O to person, place, situation, not time. Pt has hx of methamphetamine usage.

## 2017-01-27 NOTE — Progress Notes (Signed)
Ch received a order for PT who had a seizure and was put on life support. PT is current inmate of [facility]. CH informed nurse and Sheriff's deputy that C   01/27/17 1345  Clinical Encounter Type  Visited With Patient  Visit Type Initial  Referral From Nurse  Consult/Referral To Chaplain  Spiritual Encounters  Spiritual Needs Prayer  H would be available if needed.

## 2017-01-28 ENCOUNTER — Inpatient Hospital Stay

## 2017-01-28 LAB — COMPREHENSIVE METABOLIC PANEL
ALT: 17 U/L (ref 17–63)
AST: 21 U/L (ref 15–41)
Albumin: 3.2 g/dL — ABNORMAL LOW (ref 3.5–5.0)
Alkaline Phosphatase: 78 U/L (ref 38–126)
Anion gap: 4 — ABNORMAL LOW (ref 5–15)
BUN: 10 mg/dL (ref 6–20)
CHLORIDE: 106 mmol/L (ref 101–111)
CO2: 25 mmol/L (ref 22–32)
Calcium: 8.2 mg/dL — ABNORMAL LOW (ref 8.9–10.3)
Creatinine, Ser: 1.17 mg/dL (ref 0.61–1.24)
Glucose, Bld: 84 mg/dL (ref 65–99)
POTASSIUM: 3.7 mmol/L (ref 3.5–5.1)
SODIUM: 135 mmol/L (ref 135–145)
Total Bilirubin: 0.9 mg/dL (ref 0.3–1.2)
Total Protein: 6.2 g/dL — ABNORMAL LOW (ref 6.5–8.1)

## 2017-01-28 LAB — CBC
HCT: 40.9 % (ref 40.0–52.0)
Hemoglobin: 14.1 g/dL (ref 13.0–18.0)
MCH: 32.7 pg (ref 26.0–34.0)
MCHC: 34.5 g/dL (ref 32.0–36.0)
MCV: 94.9 fL (ref 80.0–100.0)
PLATELETS: 137 10*3/uL — AB (ref 150–440)
RBC: 4.31 MIL/uL — ABNORMAL LOW (ref 4.40–5.90)
RDW: 12.7 % (ref 11.5–14.5)
WBC: 6.7 10*3/uL (ref 3.8–10.6)

## 2017-01-28 LAB — URINE CULTURE: Culture: NO GROWTH

## 2017-01-28 LAB — PROCALCITONIN

## 2017-01-28 MED ORDER — LEVETIRACETAM 500 MG PO TABS
1000.0000 mg | ORAL_TABLET | Freq: Two times a day (BID) | ORAL | Status: DC
  Administered 2017-01-28: 1000 mg via ORAL
  Filled 2017-01-28: qty 2

## 2017-01-28 MED ORDER — LEVETIRACETAM ER 500 MG PO TB24
1500.0000 mg | ORAL_TABLET | Freq: Two times a day (BID) | ORAL | Status: DC
  Filled 2017-01-28 (×2): qty 3

## 2017-01-28 MED ORDER — TOPIRAMATE 25 MG PO TABS
200.0000 mg | ORAL_TABLET | Freq: Two times a day (BID) | ORAL | Status: DC
  Filled 2017-01-28 (×2): qty 2

## 2017-01-28 MED ORDER — SERTRALINE HCL 50 MG PO TABS: 150.0000 mg | ORAL_TABLET | Freq: Every day | ORAL | Status: DC

## 2017-01-28 MED ORDER — LACOSAMIDE 50 MG PO TABS
100.0000 mg | ORAL_TABLET | Freq: Two times a day (BID) | ORAL | Status: DC
  Administered 2017-01-28 – 2017-01-29 (×2): 100 mg via ORAL
  Filled 2017-01-28 (×2): qty 2

## 2017-01-28 MED ORDER — ORAL CARE MOUTH RINSE
15.0000 mL | Freq: Two times a day (BID) | OROMUCOSAL | Status: DC
  Administered 2017-01-28: 23:00:00 15 mL via OROMUCOSAL

## 2017-01-28 MED ORDER — BICTEGRAVIR-EMTRICITAB-TENOFOV 50-200-25 MG PO TABS
1.0000 | ORAL_TABLET | Freq: Every day | ORAL | Status: DC
  Administered 2017-01-28 – 2017-01-29 (×2): 1 via ORAL
  Filled 2017-01-28 (×2): qty 1

## 2017-01-28 MED ORDER — ACETAMINOPHEN 325 MG PO TABS: 650.0000 mg | ORAL_TABLET | Freq: Four times a day (QID) | ORAL | Status: DC | PRN

## 2017-01-28 MED ORDER — SERTRALINE HCL 50 MG PO TABS
150.0000 mg | ORAL_TABLET | Freq: Every day | ORAL | Status: DC
  Administered 2017-01-28 – 2017-01-29 (×2): 150 mg via ORAL
  Filled 2017-01-28 (×2): qty 1

## 2017-01-28 MED ORDER — TOPIRAMATE 100 MG PO TABS
200.0000 mg | ORAL_TABLET | Freq: Two times a day (BID) | ORAL | Status: DC
  Administered 2017-01-28 – 2017-01-29 (×3): 200 mg via ORAL
  Filled 2017-01-28 (×4): qty 2

## 2017-01-28 MED ORDER — LACOSAMIDE 50 MG PO TABS: 100.0000 mg | ORAL_TABLET | Freq: Two times a day (BID) | ORAL | Status: DC

## 2017-01-28 MED ORDER — LEVETIRACETAM ER 500 MG PO TB24
1500.0000 mg | ORAL_TABLET | Freq: Two times a day (BID) | ORAL | Status: DC
  Administered 2017-01-28 – 2017-01-29 (×2): 1500 mg via ORAL
  Filled 2017-01-28 (×2): qty 3

## 2017-01-28 NOTE — Progress Notes (Signed)
Subjective: Patient awake and alert.  Extubated.  Unable to provide home medications but it appears this was obtained from the patient's mother.  Objective: Current vital signs: BP 116/69 (BP Location: Right Arm)   Pulse 68   Temp 98.5 F (36.9 C) (Oral)   Resp 18   Ht 5\' 6"  (1.676 m)   Wt 69.6 kg (153 lb 7 oz)   SpO2 100%   BMI 24.77 kg/m  Vital signs in last 24 hours: Temp:  [98.5 F (36.9 C)-99.9 F (37.7 C)] 98.5 F (36.9 C) (06/20 1215) Pulse Rate:  [62-100] 68 (06/20 1215) Resp:  [12-22] 18 (06/20 1215) BP: (91-126)/(60-81) 116/69 (06/20 1215) SpO2:  [92 %-100 %] 100 % (06/20 1215) FiO2 (%):  [40 %-50 %] 40 % (06/19 1952)  Intake/Output from previous day: 06/19 0701 - 06/20 0700 In: 1789.3 [I.V.:1379.3; IV Piggyback:410] Out: 635 [Urine:385; Emesis/NG output:250] Intake/Output this shift: Total I/O In: 263.8 [I.V.:263.8] Out: 325 [Urine:325] Nutritional status: Diet regular Room service appropriate? Yes; Fluid consistency: Thin  Neurologic Exam: Mental Status: Alert.. Does not know where he is.  Unable to provide the year.  Speech fluent without evidence of aphasia.  Able to follow 3 step commands without difficulty. Cranial Nerves: II: Discs flat bilaterally; Visual fields grossly normal, pupils equal, round, reactive to light and accommodation III,IV, VI: ptosis not present, extra-ocular motions intact bilaterally V,VII: smile symmetric, facial light touch sensation normal bilaterally VIII: hearing normal bilaterally IX,X: gag reflex present XI: bilateral shoulder shrug XII: midline tongue extension Motor: Right : Upper extremity   5/5    Left:     Upper extremity   5/5  Lower extremity   5/5     Lower extremity   5/5    Lab Results: Basic Metabolic Panel:  Recent Labs Lab 01/27/17 0501 01/27/17 1154 01/28/17 0549  NA 135  --  135  K 3.6  --  3.7  CL 103  --  106  CO2 19*  --  25  GLUCOSE 102*  --  84  BUN 11  --  10  CREATININE 1.30*  --   1.17  CALCIUM 8.8*  --  8.2*  MG  --  1.8  --   PHOS  --  2.2*  --     Liver Function Tests:  Recent Labs Lab 01/28/17 0549  AST 21  ALT 17  ALKPHOS 78  BILITOT 0.9  PROT 6.2*  ALBUMIN 3.2*   No results for input(s): LIPASE, AMYLASE in the last 168 hours. No results for input(s): AMMONIA in the last 168 hours.  CBC:  Recent Labs Lab 01/27/17 0501 01/28/17 0549  WBC 10.7* 6.7  HGB 15.2 14.1  HCT 44.4 40.9  MCV 93.8 94.9  PLT 165 137*    Cardiac Enzymes: No results for input(s): CKTOTAL, CKMB, CKMBINDEX, TROPONINI in the last 168 hours.  Lipid Panel: No results for input(s): CHOL, TRIG, HDL, CHOLHDL, VLDL, LDLCALC in the last 168 hours.  CBG:  Recent Labs Lab 01/27/17 1150  GLUCAP 72    Microbiology: Results for orders placed or performed during the hospital encounter of 01/27/17  Blood culture (routine x 2)     Status: None (Preliminary result)   Collection Time: 01/27/17  7:32 AM  Result Value Ref Range Status   Specimen Description BLOOD RIGHT ANTECUBITAL  Final   Special Requests   Final    BOTTLES DRAWN AEROBIC AND ANAEROBIC Blood Culture adequate volume   Culture NO GROWTH < 24  HOURS  Final   Report Status PENDING  Incomplete  Blood culture (routine x 2)     Status: None (Preliminary result)   Collection Time: 01/27/17  7:32 AM  Result Value Ref Range Status   Specimen Description BLOOD BLOOD RIGHT HAND  Final   Special Requests   Final    BOTTLES DRAWN AEROBIC AND ANAEROBIC Blood Culture adequate volume   Culture NO GROWTH < 24 HOURS  Final   Report Status PENDING  Incomplete  Urine culture     Status: None   Collection Time: 01/27/17 12:00 PM  Result Value Ref Range Status   Specimen Description URINE, CATHETERIZED  Final   Special Requests NONE  Final   Culture   Final    NO GROWTH Performed at [facility], [address] [place], [place] [address]    Report Status 01/28/2017 FINAL  Final  Culture, respiratory  (NON-Expectorated)     Status: None (Preliminary result)   Collection Time: 01/27/17 12:01 PM  Result Value Ref Range Status   Specimen Description TRACHEAL ASPIRATE  Final   Special Requests NONE  Final   Gram Stain   Final    MODERATE WBC PRESENT, PREDOMINANTLY PMN RARE SQUAMOUS EPITHELIAL CELLS PRESENT MODERATE GRAM POSITIVE COCCI IN PAIRS RARE GRAM NEGATIVE COCCOBACILLI    Culture   Final    CULTURE REINCUBATED FOR BETTER GROWTH Performed at [facility], [address] [place], [place] [address]    Report Status PENDING  Incomplete  MRSA PCR Screening     Status: None   Collection Time: 01/27/17 12:02 PM  Result Value Ref Range Status   MRSA by PCR NEGATIVE NEGATIVE Final    Comment:        The GeneXpert MRSA Assay (FDA approved for NASAL specimens only), is one component of a comprehensive MRSA colonization surveillance program. It is not intended to diagnose MRSA infection nor to guide or monitor treatment for MRSA infections.     Coagulation Studies: No results for input(s): LABPROT, INR in the last 72 hours.  Imaging: Dg Abd 1 View  Result Date: 01/27/2017 CLINICAL DATA:  Check gastric catheter placement EXAM: ABDOMEN - 1 VIEW COMPARISON:  None. FINDINGS: Scattered large and small bowel gas is noted. Gastric catheter is noted coiled within the stomach. The tip is directed towards the fundus of the stomach. No abnormal mass or abnormal calcifications are seen. No other focal abnormality is noted. IMPRESSION: Gastric catheter within the stomach. Electronically Signed   By: Alcide CleverMark  Lukens M.D.   On: 01/27/2017 14:19   Ct Head Wo Contrast  Result Date: 01/27/2017 CLINICAL DATA:  Seizure in jail, striking head. EXAM: CT HEAD WITHOUT CONTRAST TECHNIQUE: Contiguous axial images were obtained from the base of the skull through the vertex without intravenous contrast. COMPARISON:  None. FINDINGS: Brain: Large area of right frontal encephalomalacia with associated ex  vacuo dilatation of the right lateral ventricle. No acute hemorrhage, mass effect or midline shift. No subdural or extra-axial fluid collection. Basilar cisterns are patent. Vascular: No hyperdense vessel or unexpected calcification. Skull: Right temporofrontal craniotomy.  No acute fracture. Sinuses/Orbits: Mucosal thickening involving the right maxillary sinus, sphenoid sinus and ethmoid air cells. Mastoid air cells are clear. Visualized orbits are unremarkable. Other: None. IMPRESSION: 1. No evidence of acute intracranial abnormality. 2. Right frontal craniotomy with encephalomalacia of the right frontal lobe. Ex vacuo dilatation of the right lateral ventricle. 3. Paranasal sinus inflammation, involving right maxillary sinus, sphenoid  sinuses and scattered ethmoid air cells. Electronically Signed   By: Rubye Oaks M.D.   On: 01/27/2017 05:32   Dg Chest Port 1 View  Result Date: 01/28/2017 CLINICAL DATA:  Respiratory failure. EXAM: PORTABLE CHEST 1 VIEW COMPARISON:  01/27/2017. FINDINGS: Interim extubation. Mediastinum and hilar structures normal. Low lung volumes with mild basilar atelectasis. Heart size normal. No pleural effusion or pneumothorax. IMPRESSION: 1. Interim extubation. 2. Low lung volumes with mild basilar atelectasis . Similar findings noted on prior exam. Electronically Signed   By: Maisie Fus  Register   On: 01/28/2017 07:26   Dg Chest Portable 1 View  Result Date: 01/27/2017 CLINICAL DATA:  Initial evaluation for endotracheal tube placement. EXAM: PORTABLE CHEST 1 VIEW COMPARISON:  None available. FINDINGS: Endotracheal tube in place with tip positioned 3.1 cm above the carina. Heart size within normal limits. Mediastinal silhouette normal. Lungs hypoinflated. Bibasilar opacities favored to reflect atelectasis, although superimposed infiltrate could be considered in the correct clinical setting. No pulmonary edema or pleural effusion. No pneumothorax. No acute osseus abnormality.  IMPRESSION: 1. Tip of the endotracheal tube 3.1 cm above the carina. 2. Shallow lung inflation with patchy bibasilar opacities, left greater than right. Atelectasis is favored, although infiltrates could be considered in the correct clinical setting. Electronically Signed   By: Rise Mu M.D.   On: 01/27/2017 06:40    Medications:  I have reviewed the patient's current medications. Scheduled: . bictegravir-emtricitabine-tenofovir AF  1 tablet Oral Daily  . lacosamide  100 mg Oral BID  . levETIRAcetam  1,500 mg Oral BID  . mouth rinse  15 mL Mouth Rinse BID  . sertraline  150 mg Oral Daily  . topiramate  200 mg Oral BID    Assessment/Plan: Patient improved.  No further seizure activity noted.  Per mother on Keppra, Vimpat and Topamax.  Keppra and Topamax have been restarted.    Recommendations: 1.  Start Vimpat 100mg  BID and continue Topamax and Keppra at current doses.   2.  Continue seizure precautions   LOS: 1 day   Thana Farr, MD Neurology [phone] 01/28/2017  2:03 PM

## 2017-01-28 NOTE — Plan of Care (Signed)
Pt was moved to room 125. [facility] notified Jeffery KinsmanSusan Holmes [phone]. [facility] Pt was able to stand walk around and sit on the wheelchair without assist.. Pt was able to void 50 ml using the urinal independently

## 2017-01-28 NOTE — Progress Notes (Signed)
[facility] [place] [facility]   PATIENT NAME: Jeffery Holmes    MR#:  [id number]  DATE OF BIRTH:  10/21/1970  SUBJECTIVE:  Extubated No new complaints. Jail officer in the room Eating ok  REVIEW OF SYSTEMS:   Review of Systems  Constitutional: Negative for chills, fever and weight loss.  HENT: Negative for ear discharge, ear pain and nosebleeds.   Eyes: Negative for blurred vision, pain and discharge.  Respiratory: Negative for sputum production, shortness of breath, wheezing and stridor.   Cardiovascular: Negative for chest pain, palpitations, orthopnea and PND.  Gastrointestinal: Negative for abdominal pain, diarrhea, nausea and vomiting.  Genitourinary: Negative for frequency and urgency.  Musculoskeletal: Negative for back pain and joint pain.  Neurological: Positive for weakness. Negative for sensory change, speech change and focal weakness.  Psychiatric/Behavioral: Negative for depression and hallucinations. The patient is not nervous/anxious.    Tolerating Diet:yes Tolerating PT: pending  DRUG ALLERGIES:  No Known Allergies  VITALS:  Blood pressure 92/63, pulse 64, temperature 98.7 F (37.1 C), temperature source Oral, resp. rate 17, height 5\' 6"  (1.676 m), weight 69.6 kg (153 lb 7 oz), SpO2 94 %.  PHYSICAL EXAMINATION:   Physical Exam  GENERAL:  46 y.o.-year-old patient lying in the bed with no acute distress.  EYES: Pupils equal, round, reactive to light and accommodation. No scleral icterus. Extraocular muscles intact.  HEENT: Head atraumatic, normocephalic. Oropharynx and nasopharynx clear. Bruise right eye + NECK:  Supple, no jugular venous distention. No thyroid enlargement, no tenderness.  LUNGS: Normal breath sounds bilaterally, no wheezing, rales, rhonchi. No use of accessory muscles of respiration.  CARDIOVASCULAR: S1, S2 normal. No murmurs, rubs, or gallops.  ABDOMEN: Soft, nontender, nondistended. Bowel sounds  present. No organomegaly or mass.  EXTREMITIES: No cyanosis, clubbing or edema b/l.    NEUROLOGIC: Cranial nerves II through XII are intact. No focal Motor or sensory deficits b/l.   PSYCHIATRIC:  patient is alert and oriented x 3.  SKIN: No obvious rash, lesion, or ulcer.   LABORATORY PANEL:  CBC  Recent Labs Lab 01/28/17 0549  WBC 6.7  HGB 14.1  HCT 40.9  PLT 137*    Chemistries   Recent Labs Lab 01/27/17 1154 01/28/17 0549  NA  --  135  K  --  3.7  CL  --  106  CO2  --  25  GLUCOSE  --  84  BUN  --  10  CREATININE  --  1.17  CALCIUM  --  8.2*  MG 1.8  --   AST  --  21  ALT  --  17  ALKPHOS  --  78  BILITOT  --  0.9   Cardiac Enzymes No results for input(s): TROPONINI in the last 168 hours. RADIOLOGY:  Dg Abd 1 View  Result Date: 01/27/2017 CLINICAL DATA:  Check gastric catheter placement EXAM: ABDOMEN - 1 VIEW COMPARISON:  None. FINDINGS: Scattered large and small bowel gas is noted. Gastric catheter is noted coiled within the stomach. The tip is directed towards the fundus of the stomach. No abnormal mass or abnormal calcifications are seen. No other focal abnormality is noted. IMPRESSION: Gastric catheter within the stomach. Electronically Signed   By: Alcide CleverMark  Lukens M.D.   On: 01/27/2017 14:19   Ct Head Wo Contrast  Result Date: 01/27/2017 CLINICAL DATA:  Seizure in jail, striking head. EXAM: CT HEAD WITHOUT CONTRAST TECHNIQUE: Contiguous axial images were obtained from the base of the skull  through the vertex without intravenous contrast. COMPARISON:  None. FINDINGS: Brain: Large area of right frontal encephalomalacia with associated ex vacuo dilatation of the right lateral ventricle. No acute hemorrhage, mass effect or midline shift. No subdural or extra-axial fluid collection. Basilar cisterns are patent. Vascular: No hyperdense vessel or unexpected calcification. Skull: Right temporofrontal craniotomy.  No acute fracture. Sinuses/Orbits: Mucosal thickening  involving the right maxillary sinus, sphenoid sinus and ethmoid air cells. Mastoid air cells are clear. Visualized orbits are unremarkable. Other: None. IMPRESSION: 1. No evidence of acute intracranial abnormality. 2. Right frontal craniotomy with encephalomalacia of the right frontal lobe. Ex vacuo dilatation of the right lateral ventricle. 3. Paranasal sinus inflammation, involving right maxillary sinus, sphenoid sinuses and scattered ethmoid air cells. Electronically Signed   By: Rubye Oaks M.D.   On: 01/27/2017 05:32   Dg Chest Port 1 View  Result Date: 01/28/2017 CLINICAL DATA:  Respiratory failure. EXAM: PORTABLE CHEST 1 VIEW COMPARISON:  01/27/2017. FINDINGS: Interim extubation. Mediastinum and hilar structures normal. Low lung volumes with mild basilar atelectasis. Heart size normal. No pleural effusion or pneumothorax. IMPRESSION: 1. Interim extubation. 2. Low lung volumes with mild basilar atelectasis . Similar findings noted on prior exam. Electronically Signed   By: Maisie Fus  Register   On: 01/28/2017 07:26   Dg Chest Portable 1 View  Result Date: 01/27/2017 CLINICAL DATA:  Initial evaluation for endotracheal tube placement. EXAM: PORTABLE CHEST 1 VIEW COMPARISON:  None available. FINDINGS: Endotracheal tube in place with tip positioned 3.1 cm above the carina. Heart size within normal limits. Mediastinal silhouette normal. Lungs hypoinflated. Bibasilar opacities favored to reflect atelectasis, although superimposed infiltrate could be considered in the correct clinical setting. No pulmonary edema or pleural effusion. No pneumothorax. No acute osseus abnormality. IMPRESSION: 1. Tip of the endotracheal tube 3.1 cm above the carina. 2. Shallow lung inflation with patchy bibasilar opacities, left greater than right. Atelectasis is favored, although infiltrates could be considered in the correct clinical setting. Electronically Signed   By: Rise Mu M.D.   On: 01/27/2017 06:40    ASSESSMENT AND PLAN:  Jeffery Holmes  is a 46 y.o. male with a known history ofKnown seizures suspected due to traumatic brain injury with history of craniotomy in the past for unknown reason comes to the emergency room from [facility] after having intractable seizures first few times. Patient was in the jail since Monday which is 3 days ago and has not been getting his and has seizure medicine due to reason not known. He was brought to the emergency room where seizures generalized tonic-clonic were witnessed by ED M.D.   1. Generalized tonic-clonic seizures with known history of chronic seizures suspected due to patient not taking his medication since his being in the jail for last 3 days -IV Keppra 1000 twice a day---now on oral meds -Neurology consultation appreciated -CT head shows chronic changes from craniotomy in the past -Patient has history of TBI (reason not known) -pt now extubated -resumed home meds ---Topiramate also  2. Acute hypoxic respiratory failure in the setting of tonic-clonic seizures and bilateral opacities on chest x-ray with possibility of aspiration pneumonia -IV Zosyn for now---d/ced by ICU attending Wean oxygen to RA  3. History of substance abuse -Urine drug screen positive for amphetamines  4. Acute renal failure appears prerenal azotemia -recieved IV fluids -improved  5. DVT prophylaxis subcutaneous Lovenox  6. H/o HIV Resumed antiretroviral meds  Transfer to med-surgery PT to see pt  Case discussed with Care  Management/Social Worker. Management plans discussed with the patient, family and they are in agreement.  CODE STATUS: full  DVT Prophylaxis: lovenox  TOTAL TIME TAKING CARE OF THIS PATIENT: *30* minutes.  >50% time spent on counselling and coordination of care  POSSIBLE D/C IN 1-2 DAYS, DEPENDING ON CLINICAL CONDITION.  Note: This dictation was prepared with Dragon dictation along with smaller phrase technology. Any  transcriptional errors that result from this process are unintentional.  Rannie Craney M.D on 01/28/2017 at 1:28 PM  Between 7am to 6pm - Pager - [phone]  After 6pm go to [email or web address] - password [email or web address]  [facility]  Office  [phone]  CC: Primary care physician; Patient, No Pcp Per

## 2017-01-28 NOTE — Progress Notes (Signed)
Nutrition Brief Note  Spoke briefly with patient at RN's request. Patient did not eat much of breakfast, states "I'm not hungry." Offered patient ONS since he was not hungry, he declined "why would I drink that?" Denies poor appetite or weight loss prior to [place]  On HIV medications PTA - biktarvy. Does not appear critically ill upon physical exam.  Dionne AnoWilliam M. Salli Bodin, MS, RD LDN Inpatient Clinical Dietitian Pager [phone]

## 2017-01-28 NOTE — Progress Notes (Signed)
CH responded to an OR for an AD. Pt indicated that he already has an AD. CH informed that Spiritual Care services are available 24/7. Pt acknowledged. CH is available for follow up as needed.    01/28/17 1600  Clinical Encounter Type  Visited With Patient;Health care provider  Visit Type Initial;Spiritual support  Referral From Nurse  Consult/Referral To Chaplain  Spiritual Encounters  Spiritual Needs Literature

## 2017-01-29 MED ORDER — LACOSAMIDE 100 MG PO TABS: 100.0000 mg | ORAL_TABLET | Freq: Two times a day (BID) | ORAL | 1 refills | Status: AC

## 2017-01-29 NOTE — Discharge Summary (Signed)
[facility] [place] [facility]   PATIENT NAME: Jeffery ReaChristopher XXXPernell    MR#:  [id number]  DATE OF BIRTH:  03/20/1971  DATE OF ADMISSION:  01/27/2017 ADMITTING PHYSICIAN: Enedina FinnerSona Lavaya Defreitas, MD  DATE OF DISCHARGE: 01/29/2017  PRIMARY CARE PHYSICIAN: Patient, No Pcp Per    ADMISSION DIAGNOSIS:  Seizure (HCC) [R56.9] Aspiration pneumonia of both lower lobes, unspecified aspiration pneumonia type (HCC) [J69.0]  DISCHARGE DIAGNOSIS:  Seizures H/o HIV  SECONDARY DIAGNOSIS:   Past Medical History:  Diagnosis Date  . Seizures [facility](HCC)     HOSPITAL COURSE:   Jeffery Holmes a 46 y.o. malewith a known history ofKnown seizures suspected due to traumatic brain injury with history of craniotomy in the past for unknown reason [facility] having intractable seizures first few times. Patient was in the jail since Monday which is 3 days ago and has not been getting his and has seizure medicine due to reason not known. He was brought to the emergency room where seizures generalized tonic-clonic were witnessed by ED M.D.   1. Generalized tonic-clonic seizures with known history of chronic seizures suspected due to patient not taking his medication since his being in the jail for last 3 days -IV Keppra 1000 twice a day---now on oral meds -Neurology consultation appreciated -CT head shows chronic changes from craniotomy in the past -Patient has history of TBI (reason not known) -pt now extubated -resumed home meds ---Topiramate, vimpat also  2. Acute hypoxic respiratory failure in the setting of tonic-clonic seizures  -IV Zosyn for now---d/ced by ICU attending Wean oxygen to RA---sats 93% on RA  3. History of substance abuse -Urine drug screen positive for amphetamines  4. Acute renal failure appears prerenal azotemia -recieved IV fluids -improved  5. DVT prophylaxis subcutaneous Lovenox  6. H/o HIV Resumed  antiretroviral meds  Overall at baseline D[facility]. Spoke with the officer in the room  CONSULTS OBTAINED:  Treatment Team:  Kym [facility], Neuro1, MD Thana Farreynolds, Leslie, MD  DRUG ALLERGIES:  No Known Allergies  DISCHARGE MEDICATIONS:   Current Discharge Medication List    START taking these medications   Details  lacosamide 100 MG TABS Take 1 tablet (100 mg total) by mouth 2 (two) times daily. Qty: 60 tablet, Refills: 1      CONTINUE these medications which have NOT CHANGED   Details  Bictegravir-Emtricitab-Tenofov (BIKTARVY PO) Take 1 tablet by mouth daily.    Levetiracetam (KEPPRA XR) 750 MG TB24 Take 1,500 mg by mouth 2 (two) times daily.    sertraline (ZOLOFT) 50 MG tablet Take 150 mg by mouth daily.    topiramate (TOPAMAX) 100 MG tablet Take 200 mg by mouth 2 (two) times daily.        If you experience worsening of your admission symptoms, develop shortness of breath, life threatening emergency, suicidal or homicidal thoughts you must seek medical attention immediately by calling 911 or calling your MD immediately  if symptoms less severe.  You Must read complete instructions/literature along with all the possible adverse reactions/side effects for all the Medicines you take and that have been prescribed to you. Take any new Medicines after you have completely understood and accept all the possible adverse reactions/side effects.   Please note  You were cared for by a hospitalist during your hospital stay. If you have any questions about your discharge medications or the care you received while you were in the hospital after you are discharged,  you can call the unit and asked to speak with the hospitalist on call if the hospitalist that took care of you is not available. Once you are discharged, your primary care physician will handle any further medical issues. Please note that NO REFILLS for any discharge medications will be authorized once you are discharged, as  it is imperative that you return to your primary care physician (or establish a relationship with a primary care physician if you do not have one) for your aftercare needs so that they can reassess your need for medications and monitor your lab values. Today   SUBJECTIVE   Doing well  VITAL SIGNS:  Blood pressure 119/79, pulse 77, temperature 98.6 F (37 C), temperature source Oral, resp. rate 19, height 5\' 9"  (1.753 m), weight 71.9 kg (158 lb 9.6 oz), SpO2 93 %.  I/O:   Intake/Output Summary (Last 24 hours) at 01/29/17 0808 Last data filed at 01/29/17 0630  Gross per 24 hour  Intake           263.83 ml  Output             1125 ml  Net          -861.17 ml    PHYSICAL EXAMINATION:  GENERAL:  46 y.o.-year-old patient lying in the bed with no acute distress.  EYES: Pupils equal, round, reactive to light and accommodation. No scleral icterus. Extraocular muscles intact.  HEENT: Head atraumatic, normocephalic. Oropharynx and nasopharynx clear. Bruise over the right eye NECK:  Supple, no jugular venous distention. No thyroid enlargement, no tenderness.  LUNGS: Normal breath sounds bilaterally, no wheezing, rales,rhonchi or crepitation. No use of accessory muscles of respiration.  CARDIOVASCULAR: S1, S2 normal. No murmurs, rubs, or gallops.  ABDOMEN: Soft, non-tender, non-distended. Bowel sounds present. No organomegaly or mass.  EXTREMITIES: No pedal edema, cyanosis, or clubbing.  NEUROLOGIC: Cranial nerves II through XII are intact. Muscle strength 5/5 in all extremities. Sensation intact. Gait not checked.  PSYCHIATRIC: The patient is alert and oriented x 3.  SKIN: No obvious rash, lesion, or ulcer.   DATA REVIEW:   CBC   Recent Labs Lab 01/28/17 0549  WBC 6.7  HGB 14.1  HCT 40.9  PLT 137*    Chemistries   Recent Labs Lab 01/27/17 1154 01/28/17 0549  NA  --  135  K  --  3.7  CL  --  106  CO2  --  25  GLUCOSE  --  84  BUN  --  10  CREATININE  --  1.17  CALCIUM   --  8.2*  MG 1.8  --   AST  --  21  ALT  --  17  ALKPHOS  --  78  BILITOT  --  0.9    Microbiology Results   Recent Results (from the past 240 hour(s))  Blood culture (routine x 2)     Status: None (Preliminary result)   Collection Time: 01/27/17  7:32 AM  Result Value Ref Range Status   Specimen Description BLOOD RIGHT ANTECUBITAL  Final   Special Requests   Final    BOTTLES DRAWN AEROBIC AND ANAEROBIC Blood Culture adequate volume   Culture NO GROWTH 2 DAYS  Final   Report Status PENDING  Incomplete  Blood culture (routine x 2)     Status: None (Preliminary result)   Collection Time: 01/27/17  7:32 AM  Result Value Ref Range Status   Specimen Description BLOOD BLOOD RIGHT HAND  Final  Special Requests   Final    BOTTLES DRAWN AEROBIC AND ANAEROBIC Blood Culture adequate volume   Culture NO GROWTH 2 DAYS  Final   Report Status PENDING  Incomplete  Urine culture     Status: None   Collection Time: 01/27/17 12:00 PM  Result Value Ref Range Status   Specimen Description URINE, CATHETERIZED  Final   Special Requests NONE  Final   Culture   Final    NO GROWTH Performed at [facility], [address], [place], [place] [address]    Report Status 01/28/2017 FINAL  Final  Culture, respiratory (NON-Expectorated)     Status: None (Preliminary result)   Collection Time: 01/27/17 12:01 PM  Result Value Ref Range Status   Specimen Description TRACHEAL ASPIRATE  Final   Special Requests NONE  Final   Gram Stain   Final    MODERATE WBC PRESENT, PREDOMINANTLY PMN RARE SQUAMOUS EPITHELIAL CELLS PRESENT MODERATE GRAM POSITIVE COCCI IN PAIRS RARE GRAM NEGATIVE COCCOBACILLI    Culture   Final    CULTURE REINCUBATED FOR BETTER GROWTH Performed at [facility], [address], [place], [place] [address]    Report Status PENDING  Incomplete  MRSA PCR Screening     Status: None   Collection Time: 01/27/17 12:02 PM  Result Value Ref Range Status   MRSA by PCR NEGATIVE  NEGATIVE Final    Comment:        The GeneXpert MRSA Assay (FDA approved for NASAL specimens only), is one component of a comprehensive MRSA colonization surveillance program. It is not intended to diagnose MRSA infection nor to guide or monitor treatment for MRSA infections.     RADIOLOGY:  Dg Abd 1 View  Result Date: 01/27/2017 CLINICAL DATA:  Check gastric catheter placement EXAM: ABDOMEN - 1 VIEW COMPARISON:  None. FINDINGS: Scattered large and small bowel gas is noted. Gastric catheter is noted coiled within the stomach. The tip is directed towards the fundus of the stomach. No abnormal mass or abnormal calcifications are seen. No other focal abnormality is noted. IMPRESSION: Gastric catheter within the stomach. Electronically Signed   By: Alcide Clever M.D.   On: 01/27/2017 14:19   Dg Chest Port 1 View  Result Date: 01/28/2017 CLINICAL DATA:  Respiratory failure. EXAM: PORTABLE CHEST 1 VIEW COMPARISON:  01/27/2017. FINDINGS: Interim extubation. Mediastinum and hilar structures normal. Low lung volumes with mild basilar atelectasis. Heart size normal. No pleural effusion or pneumothorax. IMPRESSION: 1. Interim extubation. 2. Low lung volumes with mild basilar atelectasis . Similar findings noted on prior exam. Electronically Signed   By: Maisie Fus  Register   On: 01/28/2017 07:26     Management plans discussed with the patient, family and they are in agreement.  CODE STATUS:     Code Status Orders        Start     Ordered   01/27/17 1112  Full code  Continuous     01/27/17 1111    Code Status History    Date Active Date Inactive Code Status Order ID Comments User Context   This patient has a current code status but no historical code status.      TOTAL TIME TAKING CARE OF THIS PATIENT: *40* minutes.    Hyla Coard M.D on 01/29/2017 at 8:08 AM  Between 7am to 6pm - Pager - [phone] After 6pm go to [email or web address] - Social research officer, government  [facility]   Office  [phone]  CC: Primary  care physician; Patient, No Pcp Per

## 2017-01-29 NOTE — Evaluation (Signed)
Physical Therapy Evaluation Patient Details Name: Jeffery Holmes MRN: [id number] DOB: [birth year] Today's Date: 01/29/2017   History of Present Illness  46 y.o. male with a known history of seizures suspected due to traumatic brain injury with history of craniotomy in the past, pt comes from [place] after having intractable seizures. Patient was in the jail since 6/18 and has not been getting his and has seizure medicine.  Pt needed to be intubated, now stable and slated for d/c.   Clinical Impression  Pt able to get up to EOB and standing w/o assist.  He had somewhat altered cadence with choppy steps but was able to ambulate safely and w/o hesitation.  Pt showed good effort, no real fatigue and [facility] or assistance.     Follow Up Recommendations No PT follow up    Equipment Recommendations  None recommended by PT    Recommendations for Other Services       Precautions / Restrictions Precautions Precautions: Fall Restrictions Weight Bearing Restrictions: No      Mobility  Bed Mobility Overal bed mobility: Independent             General bed mobility comments: Pt is able to rise to EOB w/o assist  Transfers Overall transfer level: Independent Equipment used: None             General transfer comment: Pt is able to rise to standing w/o issue, showed good confidence  Ambulation/Gait Ambulation/Gait assistance: Supervision Ambulation Distance (Feet): 200 Feet Assistive device: None       General Gait Details: Pt is able to ambulate with consistent, confident cadence.  He does have regular short/choppy steps and at times is up on toes but had no LOBs or true safety concerns.   Stairs            Wheelchair Mobility    Modified Rankin (Stroke Patients Only)       Balance Overall balance assessment: Independent                                           Pertinent Vitals/Pain Pain  Assessment: No/denies pain    Home Living Family/patient expects to be discharged to:: [facility] Living Arrangements: Alone                    Prior Function Level of Independence: Independent         Comments: Pt reports he was able to do all he needed, driving, etc     Hand Dominance        Extremity/Trunk Assessment   Upper Extremity Assessment Upper Extremity Assessment: Generalized weakness (L grossly 3+/5, R grossly 4-/5)    Lower Extremity Assessment Lower Extremity Assessment: Generalized weakness       Communication   Communication: No difficulties  Cognition Arousal/Alertness: Awake/alert Behavior During Therapy: WFL for tasks assessed/performed Overall Cognitive Status: Within Functional Limits for tasks assessed                                        General Comments      Exercises     Assessment/Plan    PT Assessment Patent does not need any further PT services  PT Problem List  PT Treatment Interventions      PT Goals (Current goals can be found in the Care Plan section)  Acute Rehab PT Goals PT Goal Formulation: All assessment and education complete, DC therapy    Frequency     Barriers to discharge        Co-evaluation               AM-PAC PT "6 Clicks" Daily Activity  Outcome Measure Difficulty turning over in bed (including adjusting bedclothes, sheets and blankets)?: None Difficulty moving from lying on back to sitting on the side of the bed? : None Difficulty sitting down on and standing up from a chair with arms (e.g., wheelchair, bedside commode, etc,.)?: None Help needed moving to and from a bed to chair (including a wheelchair)?: None Help needed walking in hospital room?: None Help needed climbing 3-5 steps with a railing? : None 6 Click Score: 24    End of Session Equipment Utilized During Treatment: Gait belt Activity Tolerance: Patient tolerated treatment well Patient  left: in bed;with call bell/phone within reach (sherrif )        Time: [id number] PT Time Calculation (min) (ACUTE ONLY): 11 min   Charges:   PT Evaluation $PT Eval Low Complexity: 1 Procedure     PT G CodesMalachi Pro:        Zianne Schubring R Treysean Petruzzi, DPT 01/29/2017, 10:02 AM

## 2017-01-29 NOTE — Discharge Instructions (Signed)
[facility]

## 2017-01-30 LAB — CULTURE, RESPIRATORY: CULTURE: NORMAL

## 2017-02-01 LAB — CULTURE, BLOOD (ROUTINE X 2)
CULTURE: NO GROWTH
Culture: NO GROWTH
SPECIAL REQUESTS: ADEQUATE
Special Requests: ADEQUATE

## 2018-02-25 IMAGING — CT CT HEAD W/O CM
3 of 4 series · 15 of 47 positions shown, 18 images · non-contrast
Comparison: None.

CLINICAL DATA: Seizure in jail, striking head.

EXAM:
CT HEAD WITHOUT CONTRAST
TECHNIQUE: Contiguous axial images were obtained from the base of the skull
through the vertex without intravenous contrast.

[Series 2: head wo · axial · 0.48mm/px · z∈[-147,-12]mm · 9 of 33 slices shown, 12 images]
[im 3/33  brain]
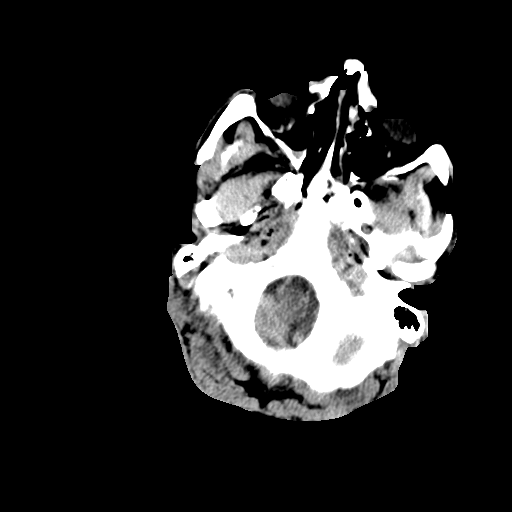
[im 3/33  bone]
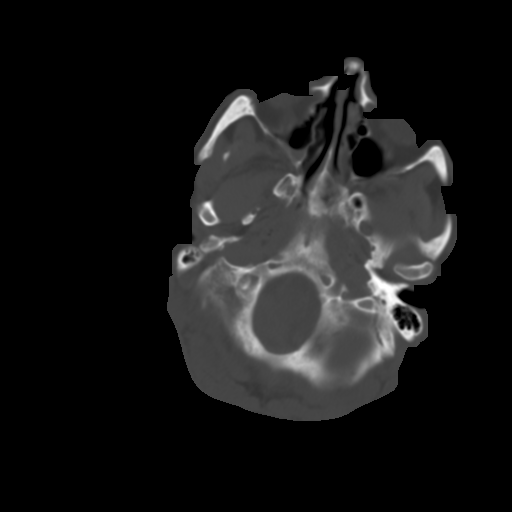
[im 7/33  brain]
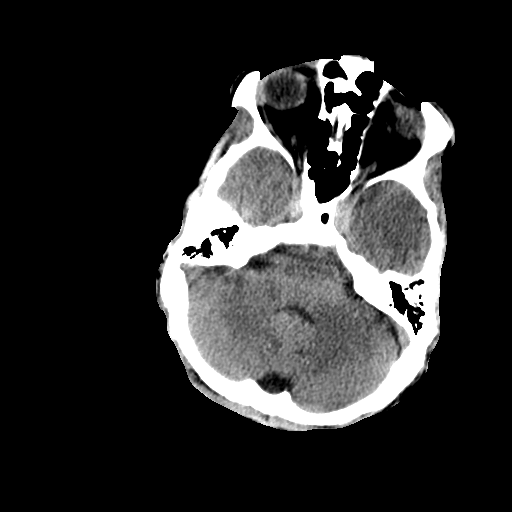
[im 10/33  brain]
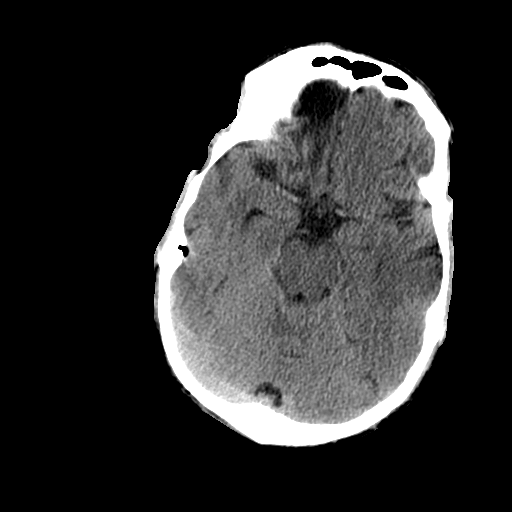
[im 14/33  brain]
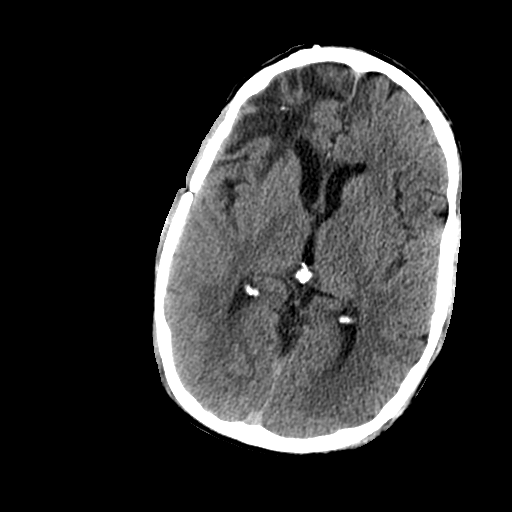
[im 17/33  brain]
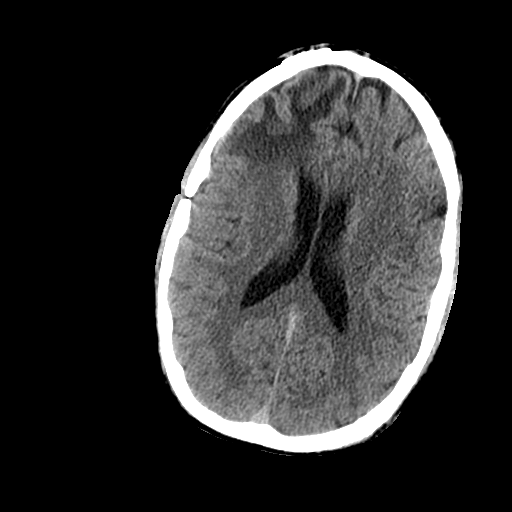
[im 17/33  bone]
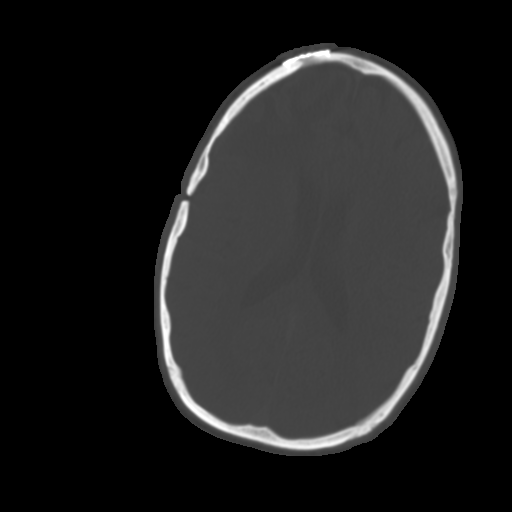
[im 19/33  brain]
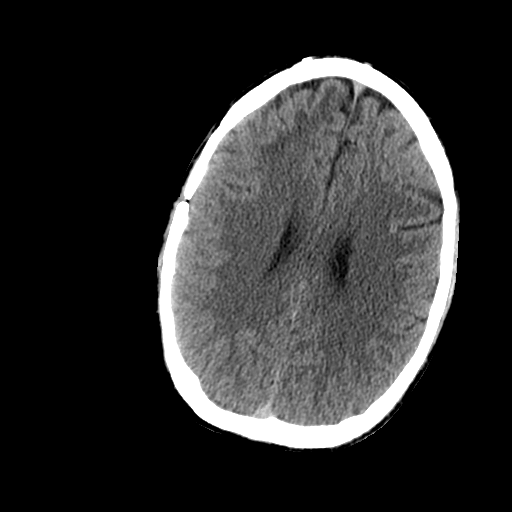
[im 23/33  brain]
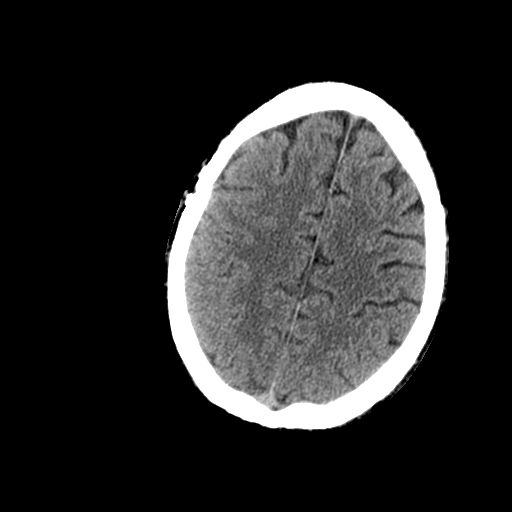
[im 26/33  brain]
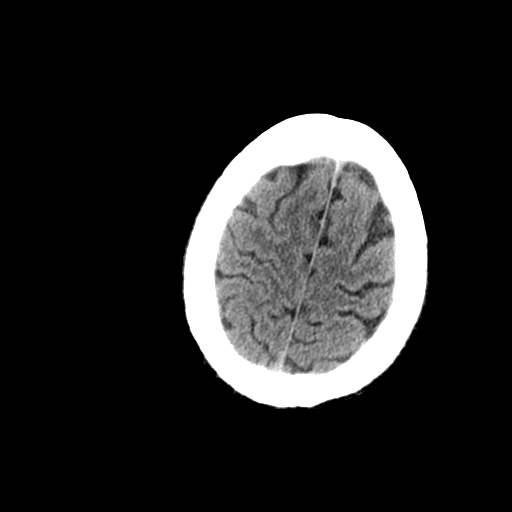
[im 30/33  brain]
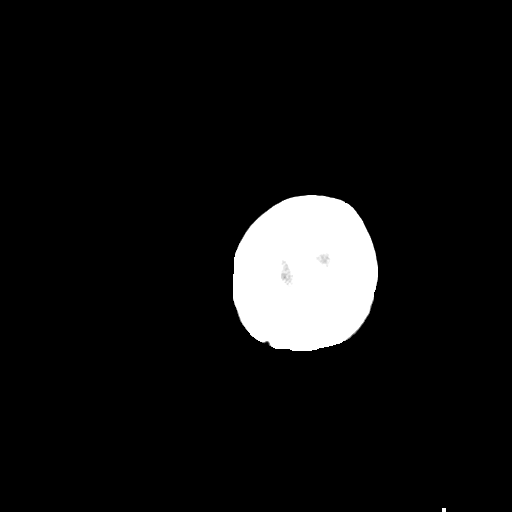
[im 30/33  bone]
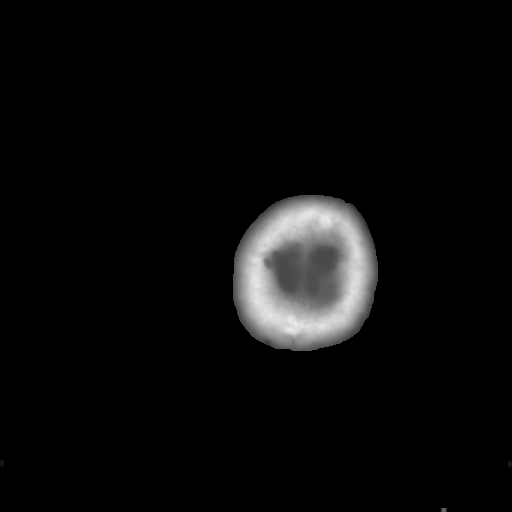

[Series 4: coronal soft tissue · coronal · 0.31mm/px · 3 of 71 slices shown]
[im 24/71  brain]
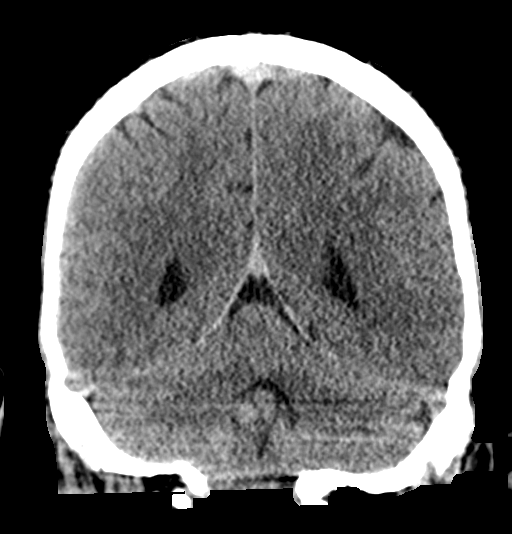
[im 32/71  brain]
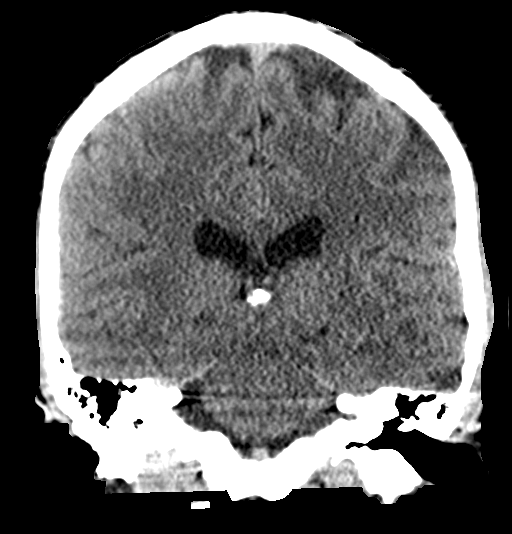
[im 39/71  brain]
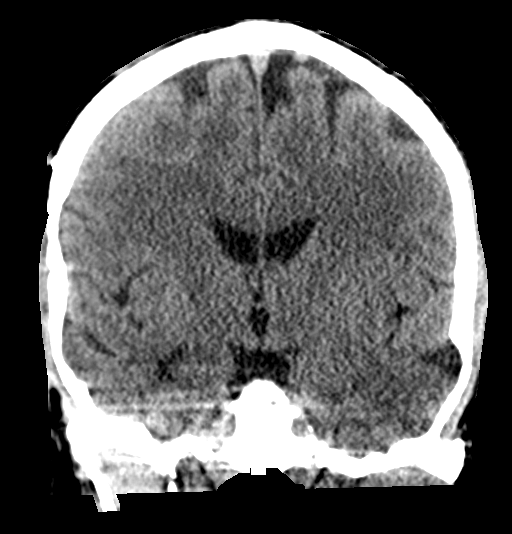

[Series 5: sagittal soft tissue · sagittal · 0.33mm/px · 3 of 53 slices shown]
[im 18/53  brain]
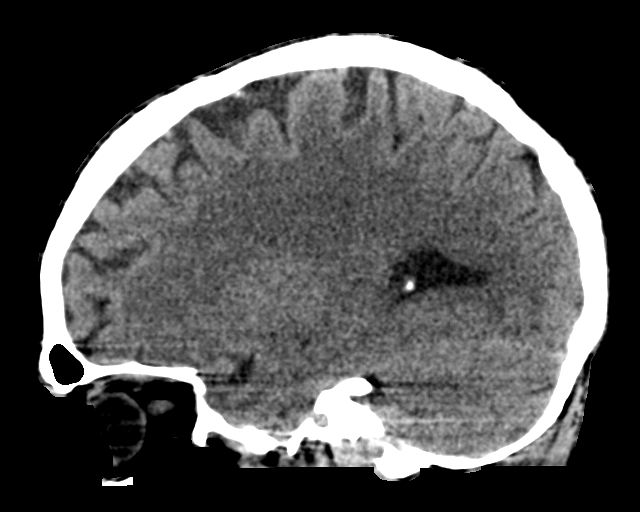
[im 27/53  brain]
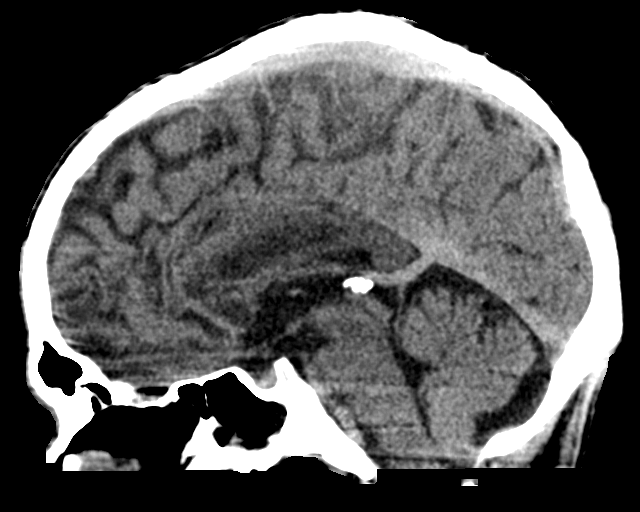
[im 35/53  brain]
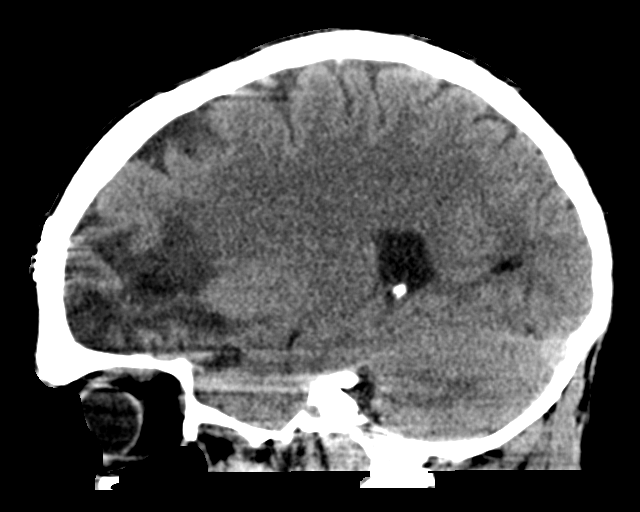

[15 of 47 positions shown; findings below may reference images not displayed]

FINDINGS: Brain: Large area of right frontal encephalomalacia with associated
ex vacuo dilatation of the right lateral ventricle. No acute
hemorrhage, mass effect or midline shift. No subdural or extra-axial
fluid collection. Basilar cisterns are patent.

Vascular: No hyperdense vessel or unexpected calcification.

Skull: Right temporofrontal craniotomy.  No acute fracture.

Sinuses/Orbits: Mucosal thickening involving the right maxillary
sinus, sphenoid sinus and ethmoid air cells. Mastoid air cells are
clear. Visualized orbits are unremarkable.

Other: None.
IMPRESSION: 1. No evidence of acute intracranial abnormality.
2. Right frontal craniotomy with encephalomalacia of the right
frontal lobe. Ex vacuo dilatation of the right lateral ventricle.
3. Paranasal sinus inflammation, involving right maxillary sinus,
sphenoid sinuses and scattered ethmoid air cells.
